# Patient Record
Sex: Female | Born: 1950 | Race: White | Hispanic: No
Health system: Midwestern US, Community
[De-identification: ages and names within clinical notes are randomized; demographics above are authoritative.]

## PROBLEM LIST (undated history)

## (undated) DIAGNOSIS — R109 Unspecified abdominal pain: Secondary | ICD-10-CM

## (undated) DIAGNOSIS — I10 Essential (primary) hypertension: Secondary | ICD-10-CM

## (undated) DIAGNOSIS — R1011 Right upper quadrant pain: Secondary | ICD-10-CM

---

## 2014-04-01 ENCOUNTER — Encounter

## 2014-04-08 LAB — EKG, 12 LEAD, INITIAL
Atrial Rate: 50 {beats}/min
Calculated P Axis: 71 degrees
Calculated R Axis: 6 degrees
Calculated T Axis: 62 degrees
P-R Interval: 176 ms
Q-T Interval: 520 ms
QRS Duration: 134 ms
QTC Calculation (Bezet): 474 ms
Ventricular Rate: 50 {beats}/min

## 2014-04-08 LAB — METABOLIC PANEL, COMPREHENSIVE
A-G Ratio: 1.3 (ref 0.7–2.8)
ALT (SGPT): 22 U/L (ref 12–78)
AST (SGOT): 17 U/L (ref 15–37)
Albumin: 3.9 g/dL (ref 3.5–4.7)
Alk. phosphatase: 55 U/L (ref 45–117)
Anion gap: 12 mmol/L (ref 10–20)
BUN: 21 mg/dL — ABNORMAL HIGH (ref 7–18)
Bilirubin, total: 0.5 mg/dL (ref 0.2–1.0)
CO2: 29 mmol/L (ref 21–32)
Calcium: 9 mg/dL (ref 8.5–10.1)
Chloride: 103 mmol/L (ref 98–107)
Creatinine: 1 mg/dL (ref 0.6–1.3)
GFR est AA: >60 mL/min/{1.73_m2} (ref >60)
GFR est non-AA: 60 mL/min/{1.73_m2} — ABNORMAL LOW (ref >60)
Globulin: 3 g/dL (ref 1.7–4.7)
Glucose: 111 mg/dL — ABNORMAL HIGH (ref 74–106)
Potassium: 4.2 mmol/L (ref 3.5–5.1)
Protein, total: 6.9 g/dL (ref 6.4–8.2)
Sodium: 140 mmol/L (ref 136–145)

## 2014-04-08 LAB — TSH 3RD GENERATION: TSH: 0.437 u[IU]/mL (ref 0.360–3.740)

## 2014-06-03 ENCOUNTER — Encounter

## 2014-06-18 LAB — CREATININE: Creatinine: 1.1 mg/dL (ref 0.6–1.3)

## 2014-06-18 LAB — BUN: BUN: 18 mg/dL (ref 7–18)

## 2014-06-18 MED ORDER — BARIUM SULFATE 2 % ORAL SUSP
2.1 % (w/v), 2.0 % (w/w) | Freq: Once | ORAL | Status: AC
Start: 2014-06-18 — End: 2014-06-18
  Administered 2014-06-18: 14:00:00 via ORAL

## 2014-06-18 MED ORDER — IOPAMIDOL 61 % IV SOLN
300 mg iodine /mL (61 %) | Freq: Once | INTRAVENOUS | Status: AC
Start: 2014-06-18 — End: 2014-06-18
  Administered 2014-06-18: 14:00:00 via INTRAVENOUS

## 2014-06-18 MED FILL — ISOVUE-300  61 % INTRAVENOUS SOLUTION: 300 mg iodine /mL (61 %) | INTRAVENOUS | Qty: 200

## 2014-06-18 MED FILL — READI-CAT 2  2.1 % (W/V), 2.0 % (W/W) ORAL SUSPENSION: 2.1 % (w/v), 2.0 % (w/w) | ORAL | Qty: 900

## 2014-07-05 ENCOUNTER — Encounter

## 2014-07-07 ENCOUNTER — Inpatient Hospital Stay: Admit: 2014-07-07 | Payer: PRIVATE HEALTH INSURANCE | Attending: Specialist

## 2014-07-07 DIAGNOSIS — R1010 Upper abdominal pain, unspecified: Secondary | ICD-10-CM

## 2014-07-10 ENCOUNTER — Ambulatory Visit: Payer: PRIVATE HEALTH INSURANCE

## 2015-06-08 ENCOUNTER — Inpatient Hospital Stay
Admit: 2015-06-08 | Discharge: 2015-06-09 | Disposition: A | Discharge status: Home / Self Care | Payer: PRIVATE HEALTH INSURANCE | Attending: Emergency Medicine

## 2015-06-08 DIAGNOSIS — M25571 Pain in right ankle and joints of right foot: Secondary | ICD-10-CM

## 2015-06-08 MED ORDER — DIPHENHYDRAMINE 25 MG CAP
25 mg | ORAL | Status: AC
Start: 2015-06-08 — End: 2015-06-08
  Administered 2015-06-09: via ORAL

## 2015-06-08 MED ORDER — KETOROLAC TROMETHAMINE 30 MG/ML INJECTION
30 mg/mL (1 mL) | INTRAMUSCULAR | Status: AC
Start: 2015-06-08 — End: 2015-06-08
  Administered 2015-06-09: via INTRAMUSCULAR

## 2015-06-08 NOTE — ED Provider Notes (Signed)
Patient is a 64 y.o. female presenting with ankle pain. The history is provided by the patient.   Ankle Pain    This is a new problem. The current episode started 2 days ago. The problem occurs constantly. The pain is present in the right foot. The quality of the pain is described as aching. The pain is severe. Associated symptoms include stiffness and itching. Pertinent negatives include no numbness, full range of motion, no back pain and no neck pain.    Patient has been having worsening irrigation in the right foot worsening for 2 days after getting a soft caste on.  She finds it itchy and painful.  No fever.  The soft cast was applied because of chronic foot pain.  It was MRIed and came out nonspecific.      Past Medical History:   Diagnosis Date   ??? Arthritis    ??? Endocrine disease    ??? Hypertension        Past Surgical History:   Procedure Laterality Date   ??? Hx gyn     ??? Hx orthopaedic           History reviewed. No pertinent family history.    Social History     Social History   ??? Marital status: DIVORCED     Spouse name: N/A   ??? Number of children: N/A   ??? Years of education: N/A     Occupational History   ??? Not on file.     Social History Main Topics   ??? Smoking status: Never Smoker   ??? Smokeless tobacco: Not on file   ??? Alcohol use No   ??? Drug use: No   ??? Sexual activity: Not on file     Other Topics Concern   ??? Not on file     Social History Narrative   ??? No narrative on file         ALLERGIES: Review of patient's allergies indicates no known allergies.    Review of Systems   Constitutional: Negative for activity change, appetite change and chills.   Eyes: Negative for discharge and itching.   Endocrine: Negative for cold intolerance and heat intolerance.   Musculoskeletal: Positive for arthralgias, joint swelling, myalgias and stiffness. Negative for back pain, gait problem and neck pain.   Skin: Positive for itching and wound. Negative for pallor and rash.    Allergic/Immunologic: Negative for environmental allergies and food allergies.   Neurological: Negative for dizziness, facial asymmetry, numbness and headaches.       Vitals:    06/08/15 2004 06/08/15 2013 06/08/15 2014   BP:   134/78   Pulse:   78   Resp:   16   Temp:   98 ??F (36.7 ??C)   SpO2:  98% 98%   Weight: 88.5 kg (195 lb)     Height: 5\' 7"  (1.702 m)              Physical Exam   Constitutional: She appears well-developed and well-nourished. No distress.   Musculoskeletal:        Right ankle: She exhibits normal range of motion, no swelling, no ecchymosis and no deformity. No tenderness. No medial malleolus, no AITFL, no CF ligament, no posterior TFL, no head of 5th metatarsal and no proximal fibula tenderness found.        Right foot: There is decreased range of motion, tenderness (medial) and bony tenderness. There is no swelling, normal capillary refill, no crepitus,  no deformity and no laceration.   Soft caste removed and irritated and wrinkley   Skin: She is not diaphoretic.   Nursing note and vitals reviewed.       MDM  Number of Diagnoses or Management Options     Amount and/or Complexity of Data Reviewed  Clinical lab tests: reviewed and ordered      ED Course       Procedures               <EMERGENCY DEPARTMENT CASE SUMMARY>    Impression/Differential Diagnosis: foot irritation    Plan: dc home    ED Course: sx improved after soft cast removed.  Ace wrap reapplied    Final Impression/Diagnosis: right ankle irritation     Patient condition at time of disposition: stable      I have reviewed the following home medications:    Prior to Admission medications    Medication Sig Start Date End Date Taking? Authorizing Provider   aspirin 81 mg chewable tablet Take 81 mg by mouth daily.   Yes Phys Other, MD   losartan-hydrochlorothiazide (HYZAAR) 100-12.5 mg per tablet Take 1 Tab by mouth daily.   Yes Phys Other, MD   clonazePAM (KLONOPIN) 1 mg tablet Take  by mouth two (2) times a day.   Yes Phys Other, MD    topiramate (TOPAMAX) 100 mg tablet Take  by mouth two (2) times a day.   Yes Phys Other, MD   escitalopram oxalate (LEXAPRO) 20 mg tablet Take 20 mg by mouth daily.   Yes Phys Other, MD   gabapentin (NEURONTIN) 100 mg capsule Take 200 mg by mouth daily.   Yes Phys Other, MD   methotrexate (RHEUMATREX) 2.5 mg tablet Take 15 mg by mouth every Wednesday.   Yes Phys Other, MD   folic acid (FOLVITE) 1 mg tablet Take 1 mg by mouth daily.   Yes Phys Other, MD   Levothyroxine (TIROSINT) 137 mcg cap Take  by mouth.   Yes Phys Other, MD         Leonia Corona, MD

## 2015-06-08 NOTE — ED Triage Notes (Signed)
Arrived in ER c/o right ankle pain since weeks; cast placed on Tuesday by her podiatrist. Pain 10/10 reported. Bandage removed by MD Cartmill; cleansed and ace bandage applied with air cast.

## 2015-06-08 NOTE — ED Notes (Signed)
Patient is AAOX3, is in no respiratory distress,  Reduced pain, speech clear and coherent, gait steady, friend at bedside, FUI given, understanding verbalized; discharged to home/self care in stable condition as per MD Cartmill; left ER with her friend.

## 2015-06-09 MED FILL — KETOROLAC TROMETHAMINE 30 MG/ML INJECTION: 30 mg/mL (1 mL) | INTRAMUSCULAR | Qty: 2

## 2015-06-09 MED FILL — DIPHENHYDRAMINE 25 MG CAP: 25 mg | ORAL | Qty: 1

## 2021-01-10 IMAGING — MR MRI LUMBAR SPINE WITHOUT CONTRAST
4 of 7 series · 20 of 48 positions shown · IV contrast (gadolinium)
Comparison: None

MRI LUMBAR SPINE WITHOUT CONTRAST, 01/10/2021 [DATE]: 
CLINICAL INDICATION: Lumbar pain, left lower extremity radiculopathy.
TECHNIQUE: Multiplanar, multiecho position MR images of the lumbar spine were 
performed without intravenous gadolinium enhancement.

[Series 101: survey · axial · 10.0mm · 1.39mm/px · z∈[-33,+201]mm · 4 of 10 slices shown]
[im 1/10]
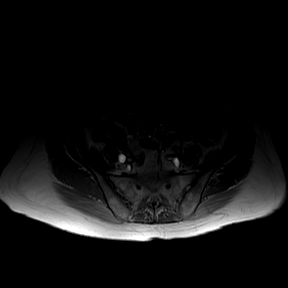
[im 4/10]
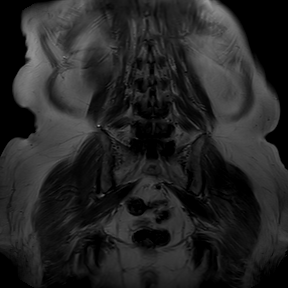
[im 7/10]
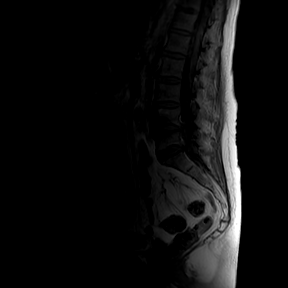
[im 10/10]
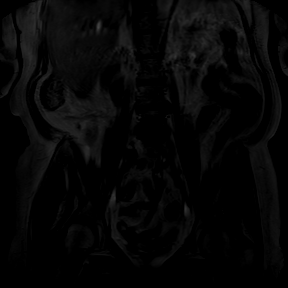

[Series 201: t2w_cor-surv · coronal · 6.0mm · 0.60mm/px · 2 of 5 slices shown]
[im 1/5]
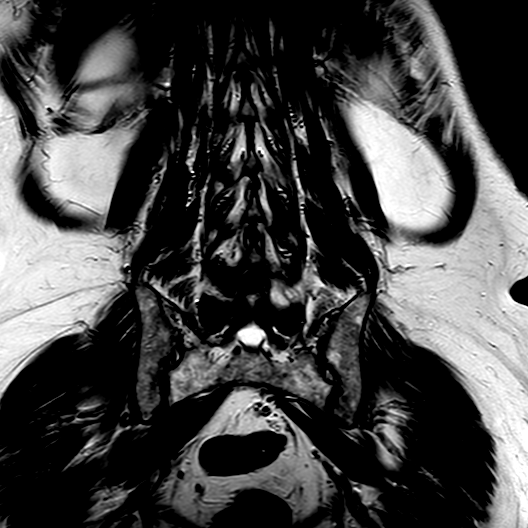
[im 5/5]
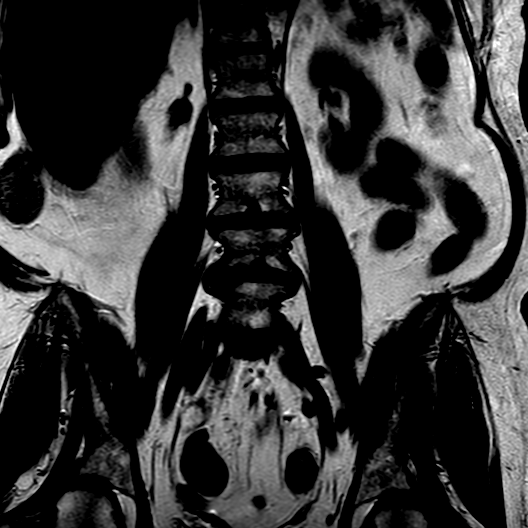

[Series 301: t1_tse_sag · sagittal · 4.0mm · 0.48mm/px · 5 of 17 slices shown]
[im 1/17]
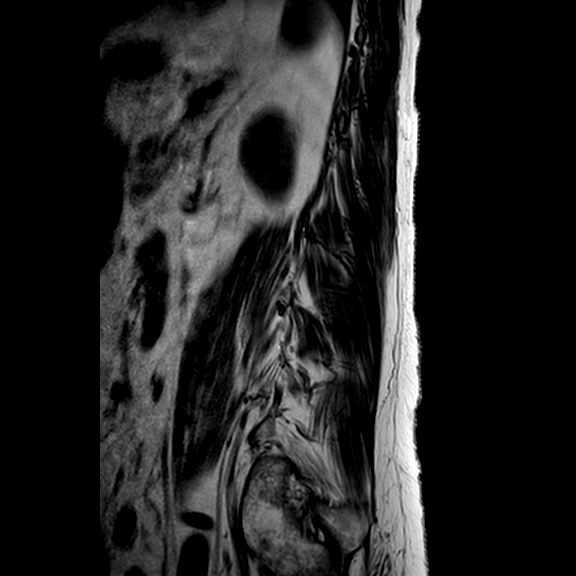
[im 4/17]
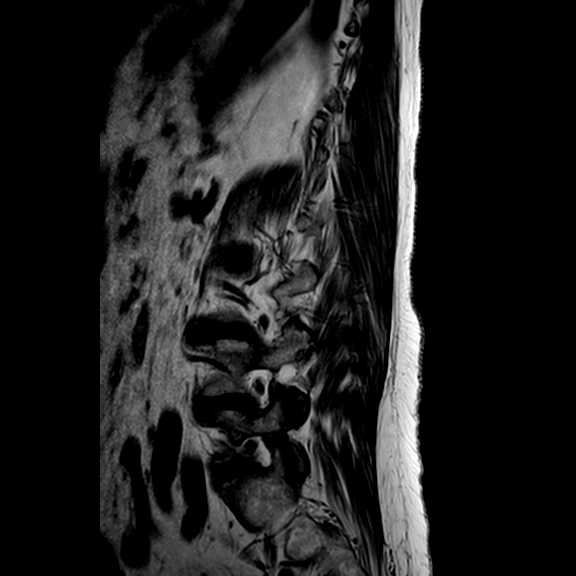
[im 7/17]
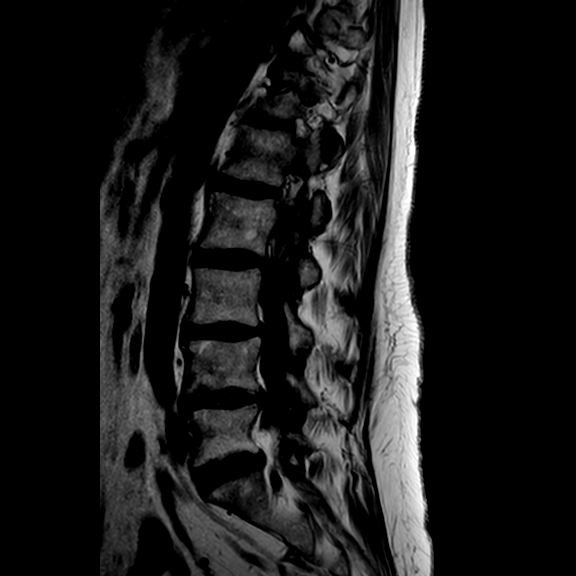
[im 10/17]
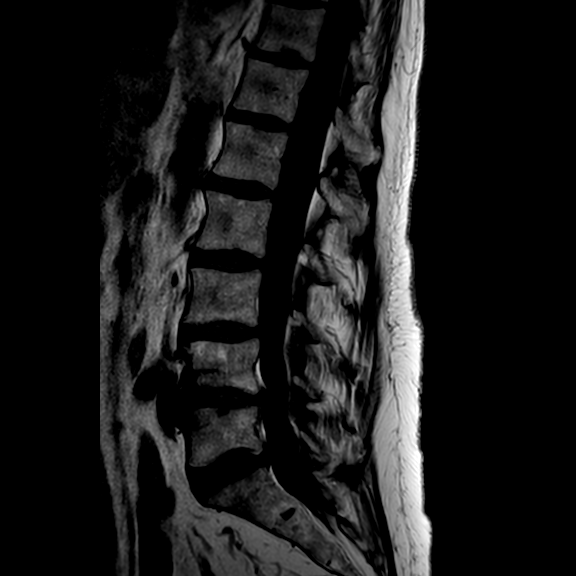
[im 17/17]
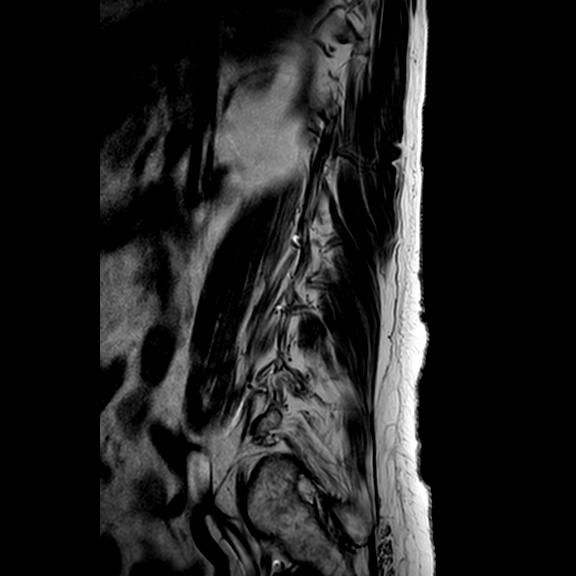

[Series 701: T1 · axial · 4.0mm · 0.38mm/px · z∈[-36,+147]mm · 9 of 35 slices shown]
[im 1/35]
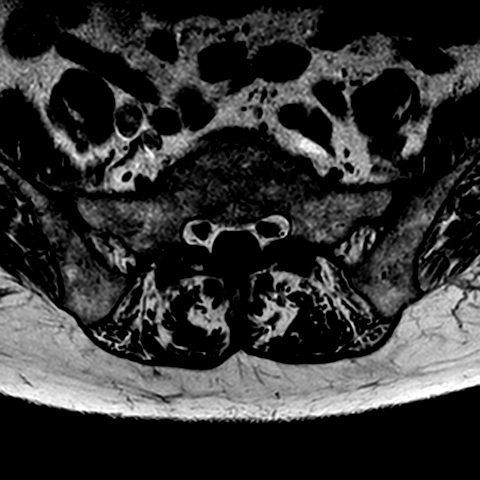
[im 6/35]
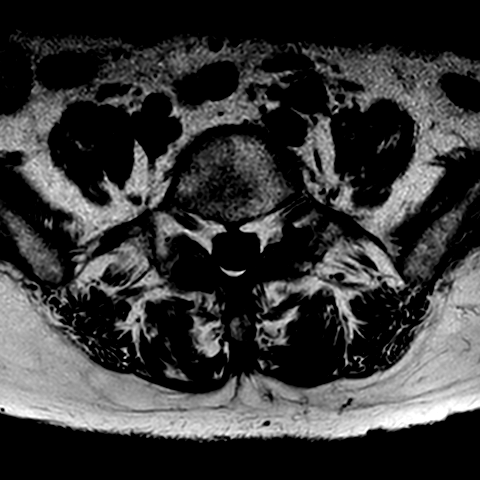
[im 12/35]
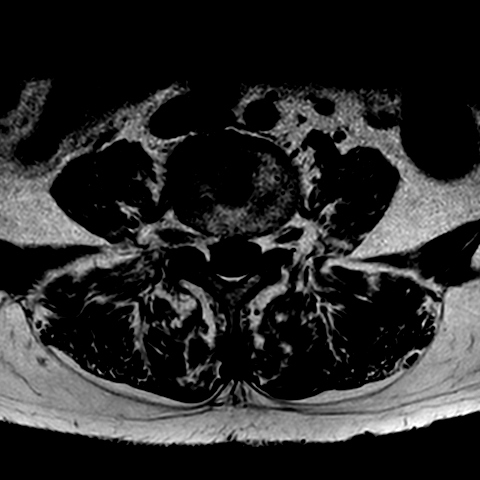
[im 15/35]
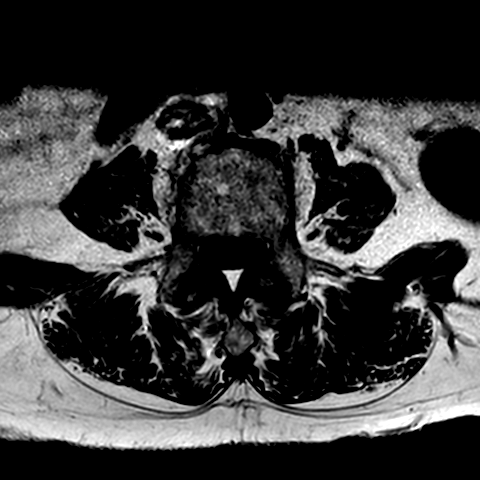
[im 18/35]
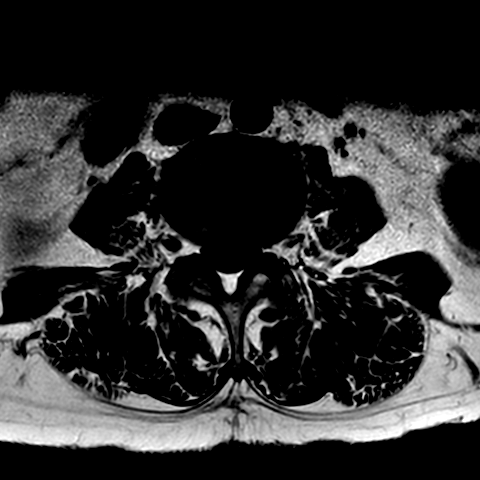
[im 20/35]
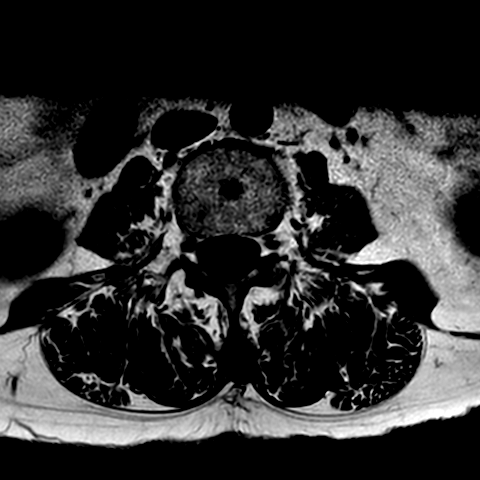
[im 23/35]
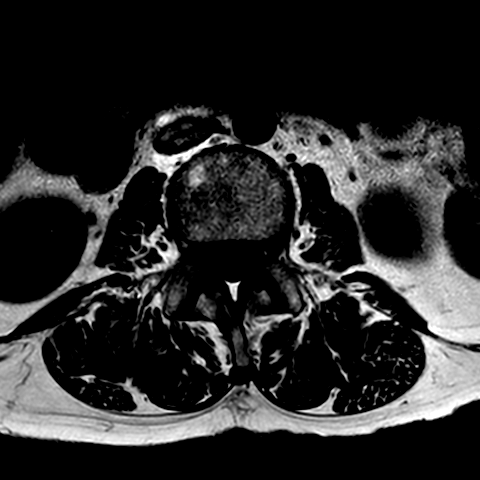
[im 29/35]
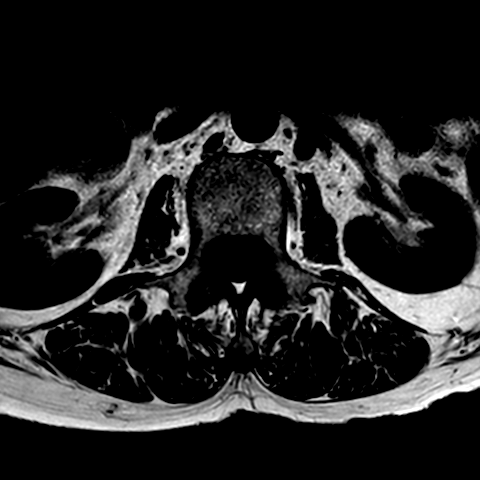
[im 35/35]
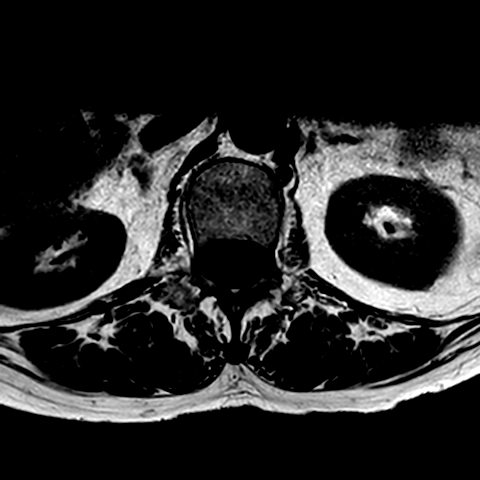

[20 of 48 positions shown; findings below may reference images not displayed]

FINDINGS: Nomenclature is based on 5 lumbar type vertebral bodies. Mild chronic 
L4 inferior endplate compression fracture. L3-5 Schmorl nodes. Small marginal 
osteophytes at L3-5. No spondylolisthesis. No pars defect. The conus tip 
terminates at the L1 vertebral body level. The aorta is normal in diameter. The 
posterior paraspinal soft tissues are negative. No abnormal enhancement within 
the spinal cord or spinal canal. No abnormal disc enhancement. 
Modic I-II: Type I Modic changes at L4-5 on the right. 
Ligamentum Flavum > 2.5 mm: All levels 
T12-L1: The disc is normal in height and signal. No disc herniation. Normal 
facets. No spinal canal or neural foraminal stenosis. 
L1-L2: The disc is normal in height and signal. No disc herniation. Mild facet 
arthropathy. No spinal canal or neural foraminal stenosis. 
L2-L3: The disc is normal in height and signal. No disc herniation. Mild facet 
arthropathy. No spinal canal or neural foraminal stenosis. 
L3-L4: Mild disc bulge, mild disc space narrowing and disc desiccation. No disc 
herniation. Mild facet arthropathy and ligamentum flavum hypertrophy. Prominent 
dorsal epidural fat. Mild central canal/lateral recess stenosis. No neural 
foraminal stenosis. 
L4-L5: Small central annular tear. Broad-based disc bulge, more marked on the 
right. Moderate disc space narrowing and disc desiccation. No disc herniation. 
Moderate facet arthropathy and ligamentum flavum hypertrophy. Prominent dorsal 
epidural fat. Mild central canal/lateral recess stenosis. Mild right neural 
foraminal stenosis. 
L5-S1: Broad-based disc bulge. The disc is normal in height and signal. No disc 
herniation. Mild facet arthropathy and small posterior facet joint cysts. No 
spinal canal stenosis. Mild left neural foraminal stenosis.
IMPRESSION: 1.  Multifocal degenerative change and mild chronic L4 inferior endplate 
compression fracture. 
2.  L3-L4 mild central canal/lateral recess stenosis. 
3.  L4-L5 type I Modic changes, small central annular tear, mild central 
canal/lateral recess/right neural foraminal stenosis. 
4.  L5-S1 mild left neural foraminal stenosis.

## 2021-04-03 IMAGING — DX HAND 3 VIEWS LEFT
3 series · 3 of 3 positions shown · non-contrast
Comparison: None.

FINAL Diagnostic Imaging Report 
________________________________________________________________________________________________ 
HAND 3 VIEWS LEFT, HAND 3 VIEWS RIGHT, 04/03/2021 [DATE]: 
CLINICAL INDICATION: Bilateral hand pain.

[PA]
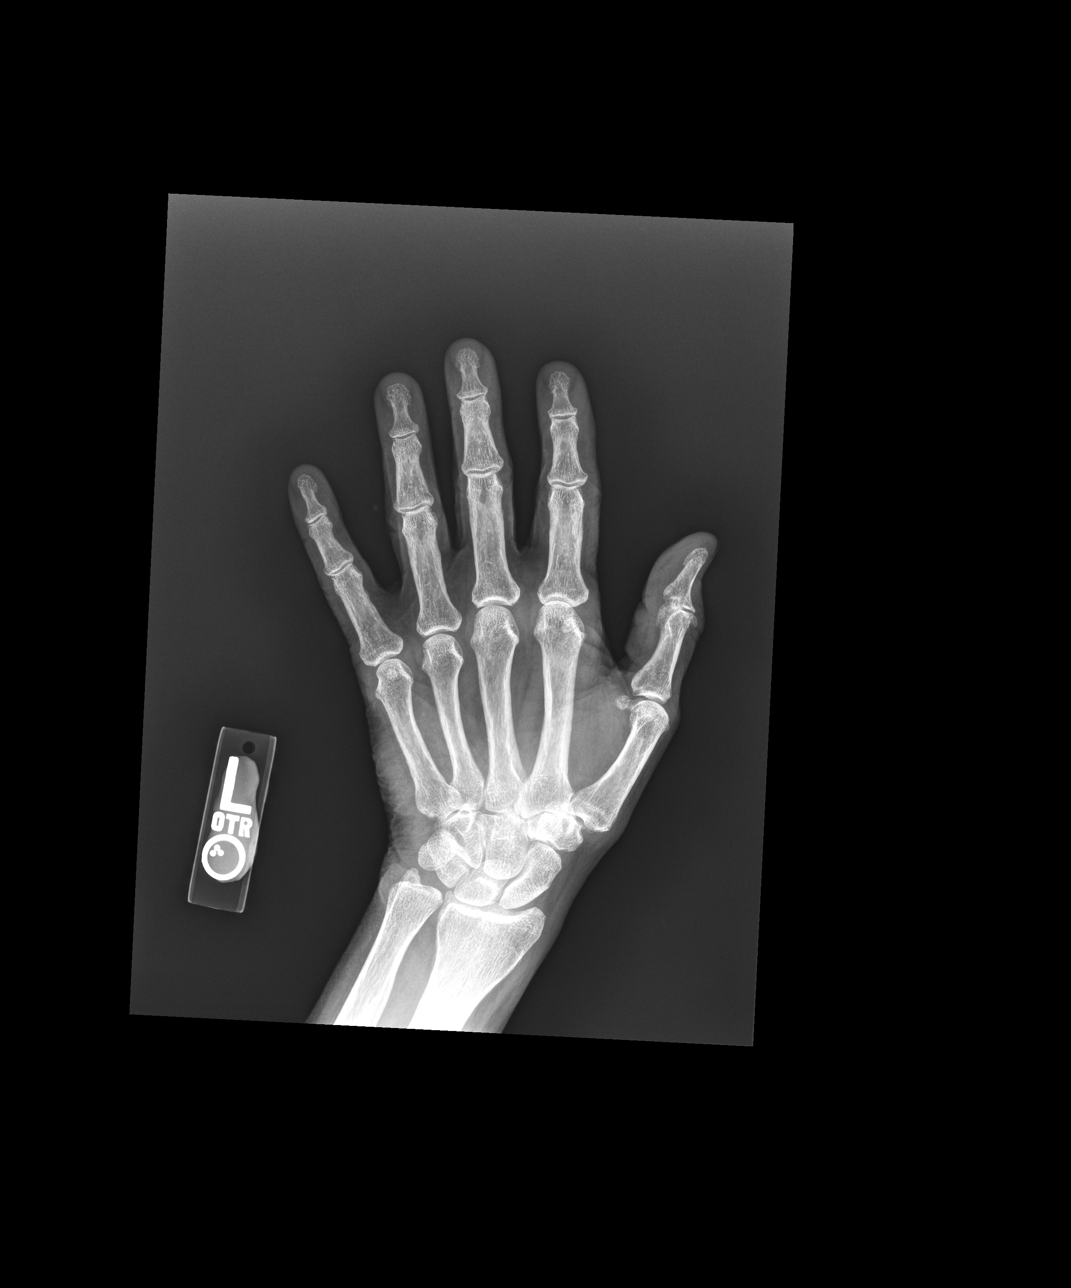

[oblique]
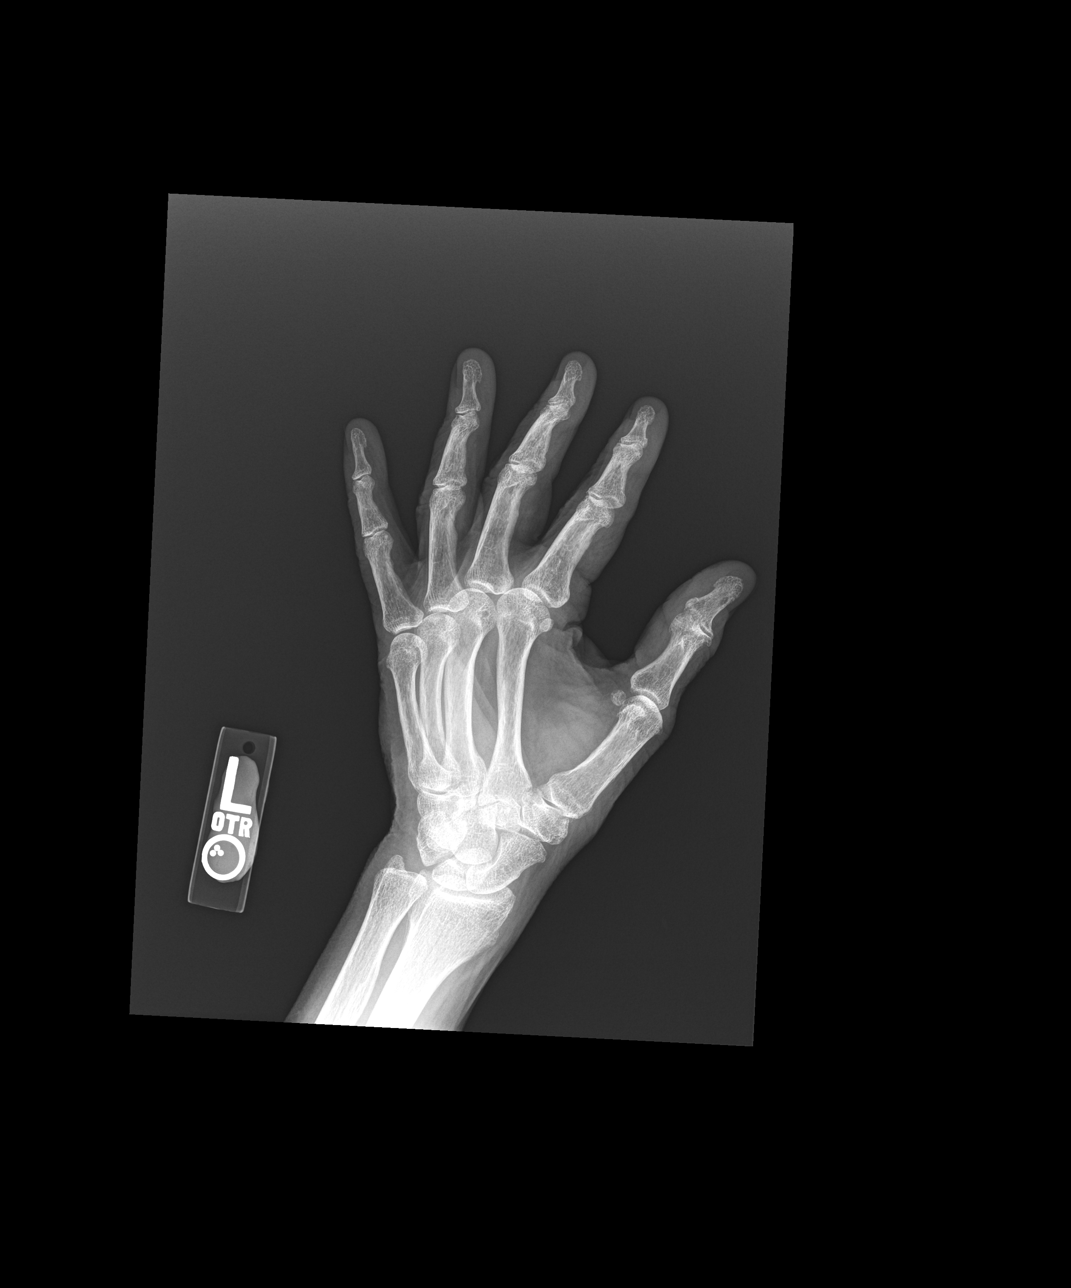

[lateral]
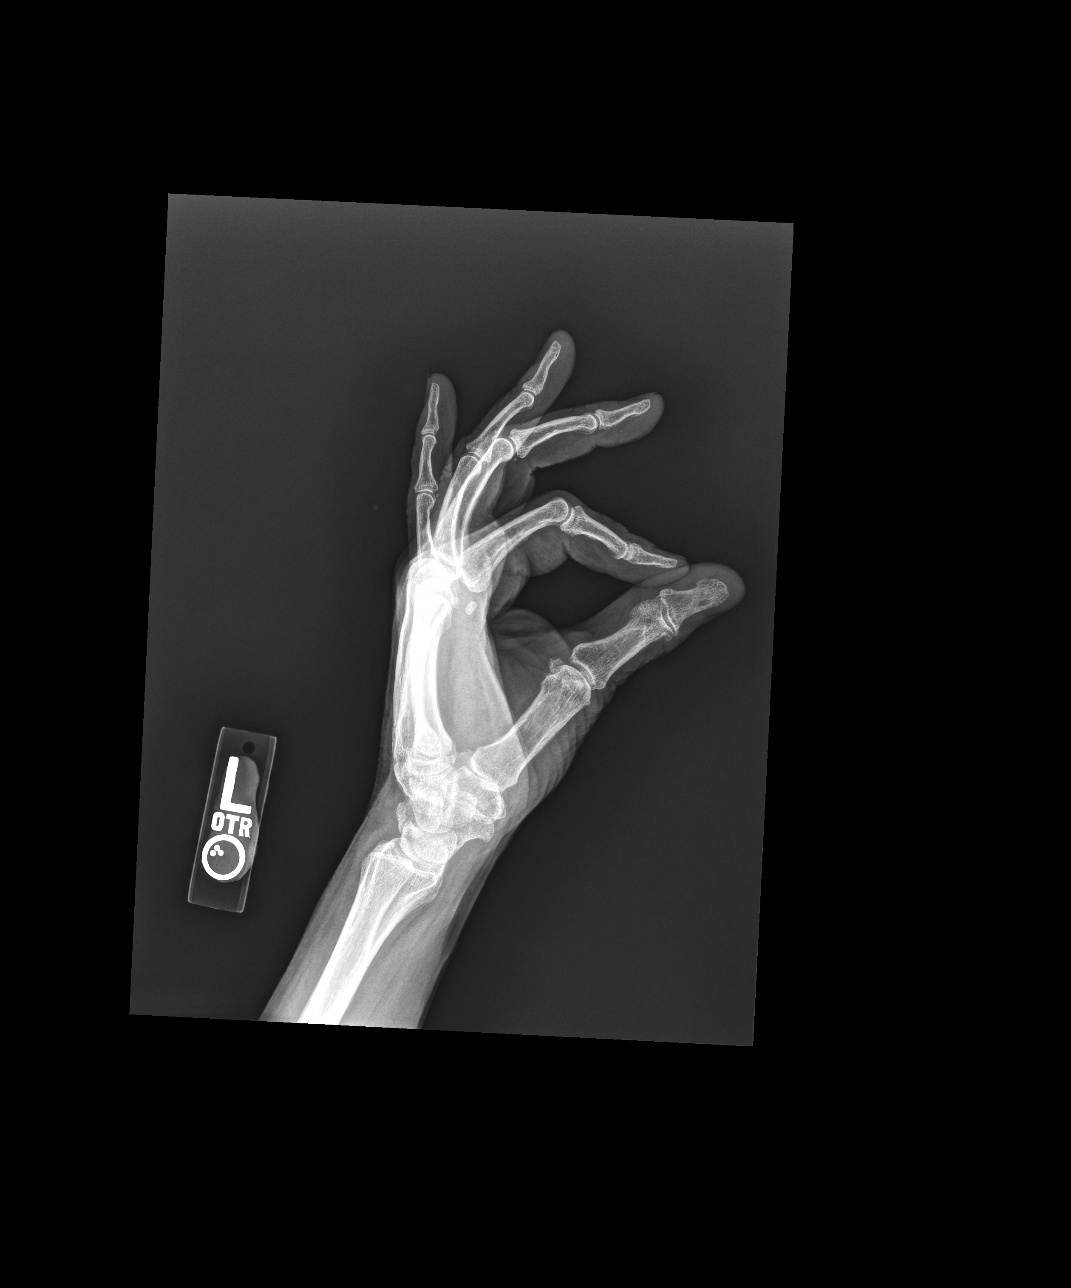

[3 of 3 positions shown; findings below may reference images not displayed]

FINDINGS: No fractures or dislocations. Moderate degenerative change of the 
right thumb carpometacarpal (B6B-Z) joint, mild degenerative change of the right 
thumb and second metacarpophalangeal (MCP) joints and degenerative change of the 
several right interphalangeal (IP) joints, most marked involving the thumb. 
Remodeling of the right thumb metacarpal base, likely chronic, healed fracture 
deformity. Mild degenerative change of the left B6B-Z and second MCP joints and 
moderate degenerative change of the left thumb IP joint. Normal bone density. No 
soft tissue swelling.
IMPRESSION: 1.  Multifocal degenerative change, most marked involving the right B6B-Z joint. 
2.  Probable chronic, healed right thumb metacarpal base fracture deformity.

## 2021-04-03 IMAGING — DX HAND 3 VIEWS RIGHT
3 series · 3 of 3 positions shown · non-contrast
Comparison: None.

FINAL Diagnostic Imaging Report 
________________________________________________________________________________________________ 
HAND 3 VIEWS LEFT, HAND 3 VIEWS RIGHT, 04/03/2021 [DATE]: 
CLINICAL INDICATION: Bilateral hand pain.

[PA]
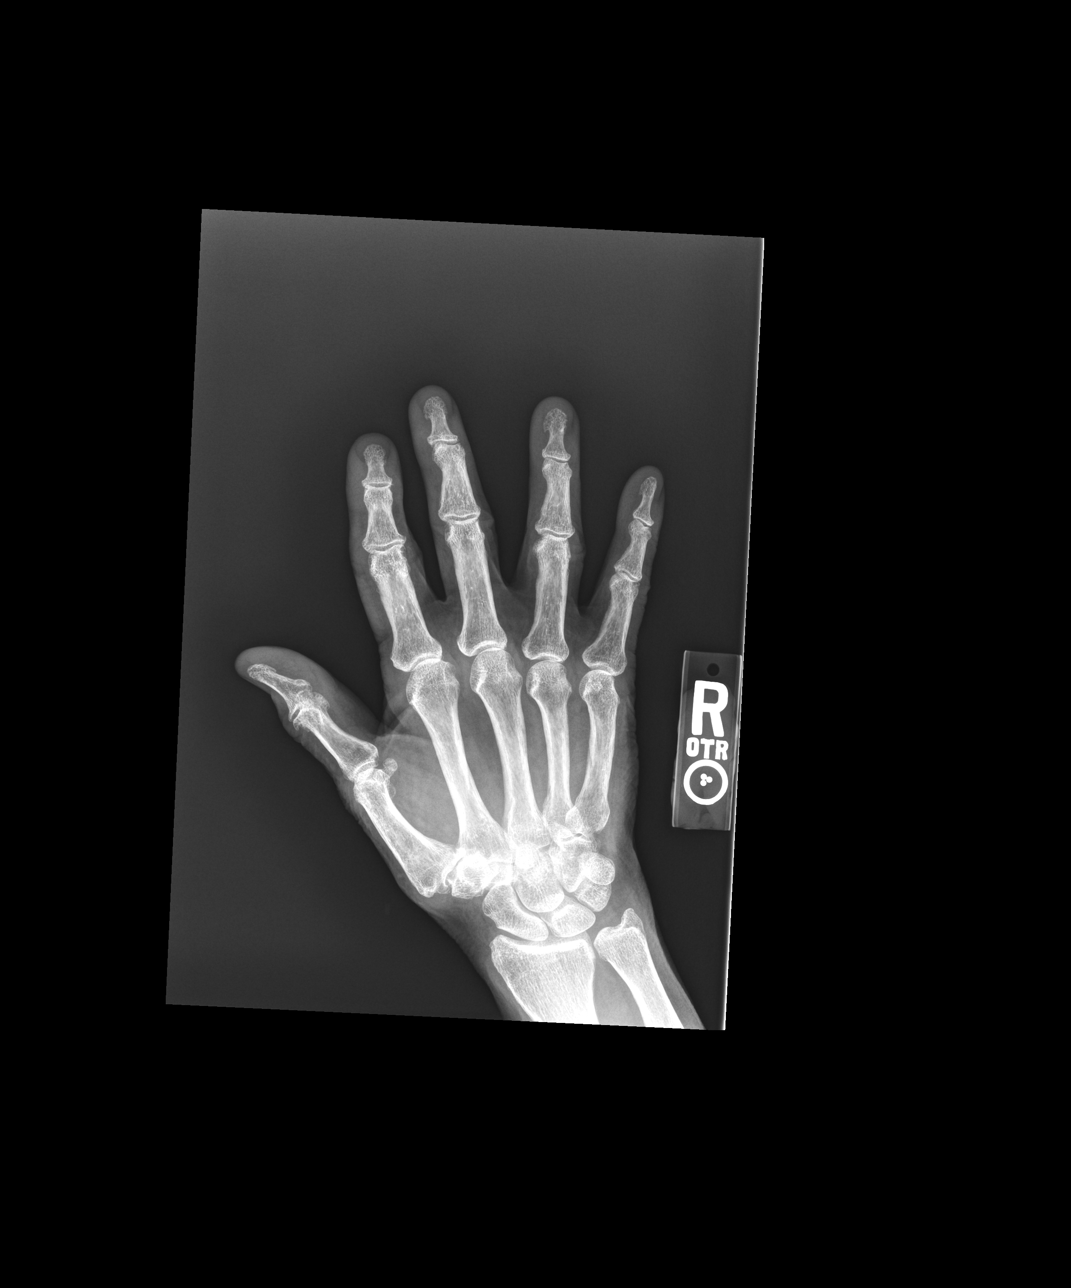

[oblique]
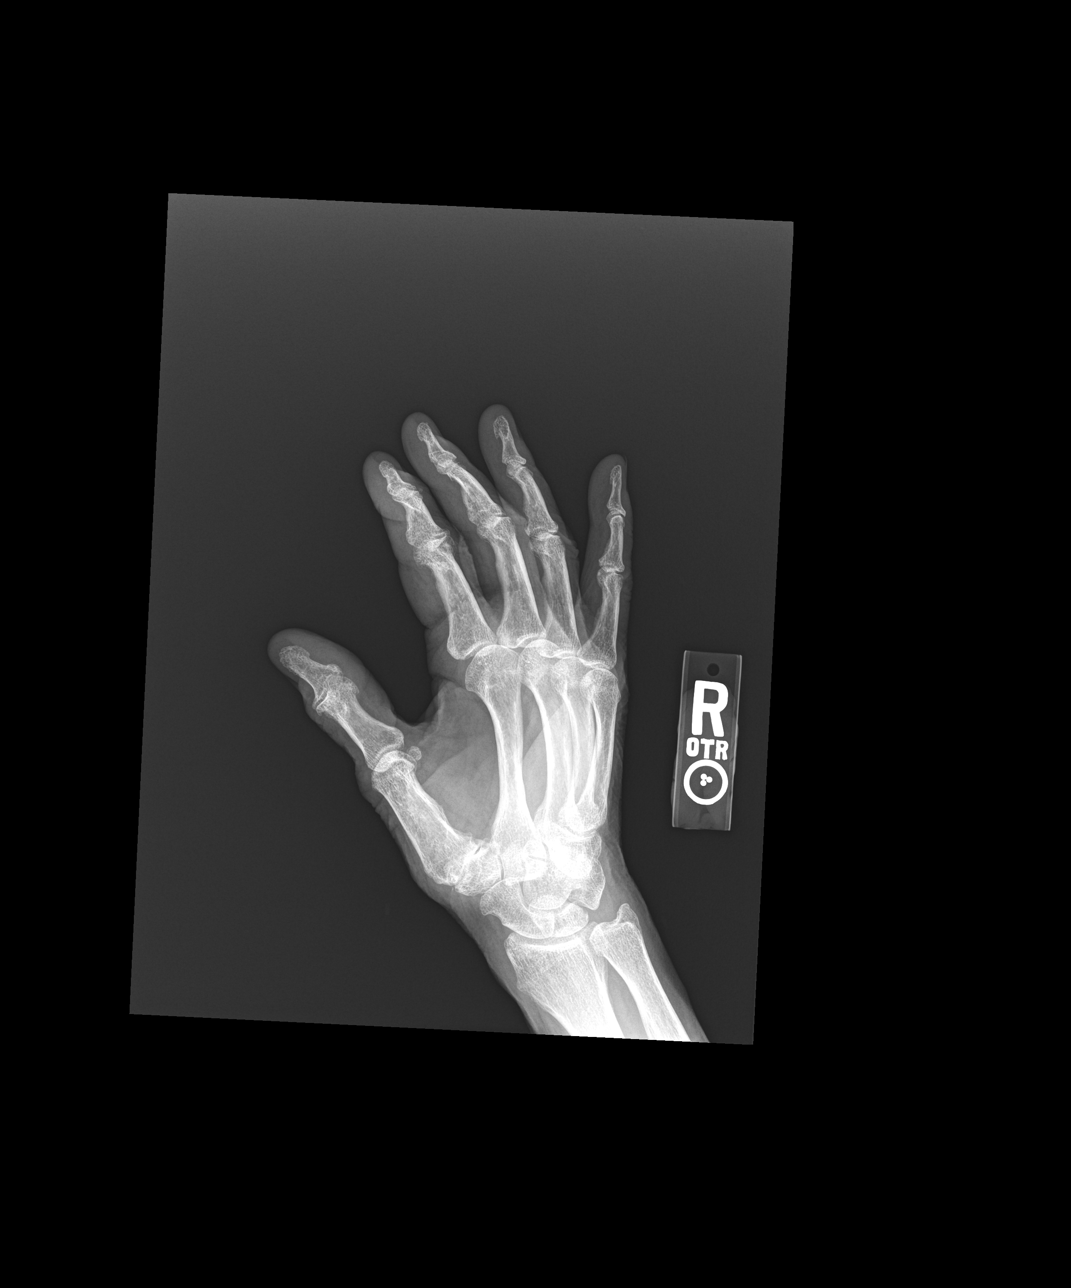

[lateral]
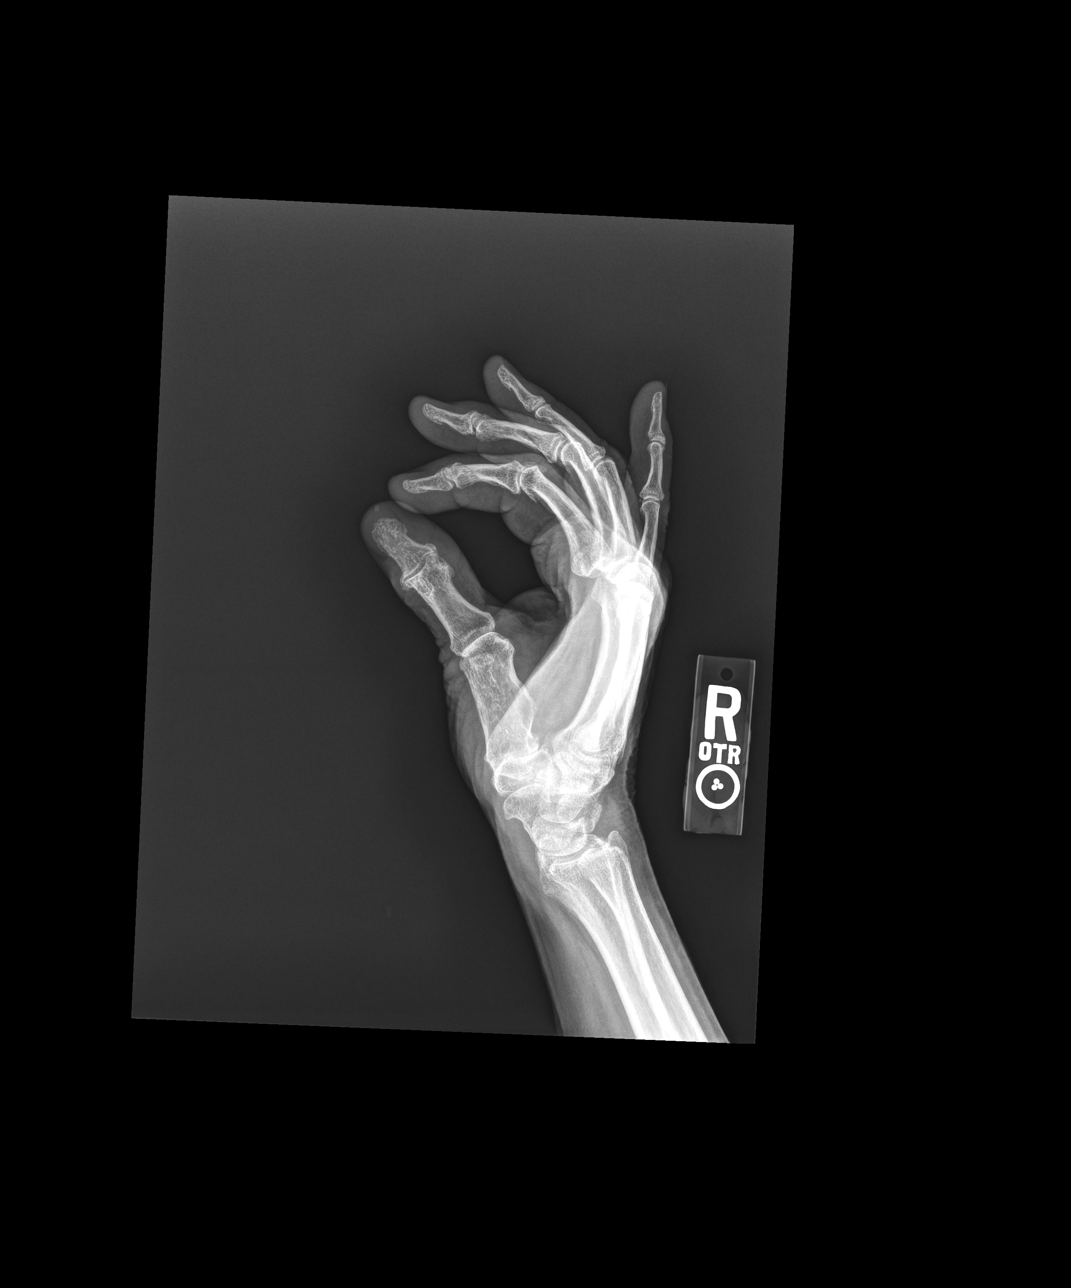

[3 of 3 positions shown; findings below may reference images not displayed]

FINDINGS: No fractures or dislocations. Moderate degenerative change of the 
right thumb carpometacarpal (B6B-Z) joint, mild degenerative change of the right 
thumb and second metacarpophalangeal (MCP) joints and degenerative change of the 
several right interphalangeal (IP) joints, most marked involving the thumb. 
Remodeling of the right thumb metacarpal base, likely chronic, healed fracture 
deformity. Mild degenerative change of the left B6B-Z and second MCP joints and 
moderate degenerative change of the left thumb IP joint. Normal bone density. No 
soft tissue swelling.
IMPRESSION: 1.  Multifocal degenerative change, most marked involving the right B6B-Z joint. 
2.  Probable chronic, healed right thumb metacarpal base fracture deformity.

## 2023-04-30 IMAGING — MR MRI BRAIN WITH  IAC W/WO CONTRAST
12 of 15 series · 26 of 48 positions shown · IV contrast (Gadolinium)
Comparison: CT sinus April 30, 2023.

________________________________________________________________________________________________ 
MRI BRAIN WITH  IAC W/WO CONTRAST,04/30/2023 [DATE]: 
CLINICAL INDICATION: Dizziness And Giddiness , hearing loss. Dizziness. Falling.
TECHNIQUE: MRI performed without and with contrast using IAC protocol. 7.5 mL of 
Gadavist were injected intravenously by hand. Patient was scanned on a
magnet.

[Series 102: mpr - smartbrain · axial · 1.1mm · 1.09mm/px · z∈[+0,+174]mm · 2 of 2 slices shown]
[im 1/2]
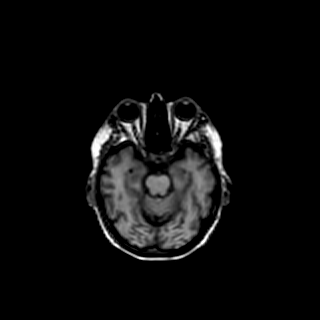
[im 2/2]
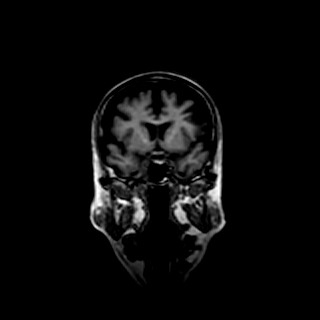

[Series 203: dadc map · axial · 5.0mm · 1.03mm/px · z∈[-61,+94]mm · 2 of 27 slices shown (1 of 2)]
[im 1/27]
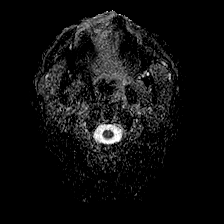
[im 27/27]
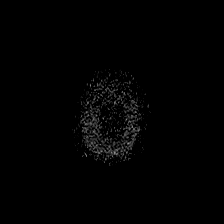

[Series 204: (id) · axial · 5.0mm · 1.03mm/px · z∈[-61,+94]mm · 2 of 27 slices shown (1 of 2)]
[im 1/27]
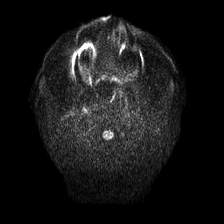
[im 27/27]
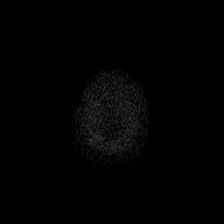

[Series 303: dadc map · coronal · 5.0mm · 0.81mm/px · 2 of 30 slices shown (2 of 2)]
[im 1/30]
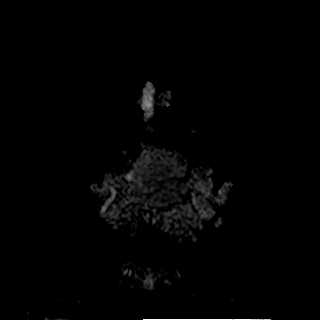
[im 30/30]
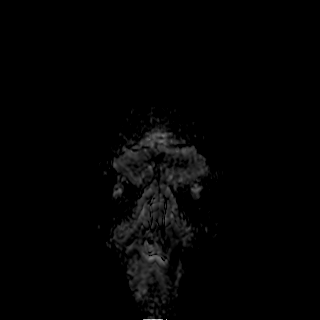

[Series 304: (id) · coronal · 5.0mm · 0.81mm/px · 2 of 31 slices shown (2 of 2)]
[im 1/31]
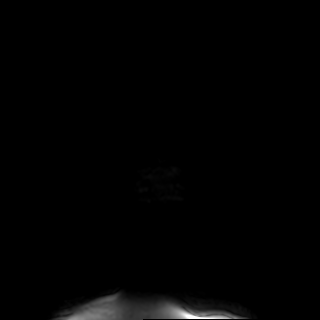
[im 31/31]
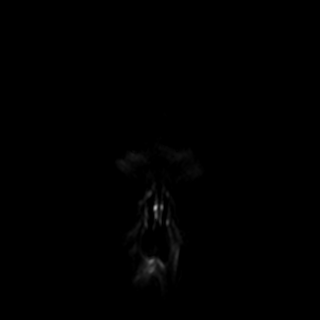

[Series 401: t1_se_sag · sagittal · 4.0mm · 0.43mm/px · 2 of 28 slices shown]
[im 1/28]
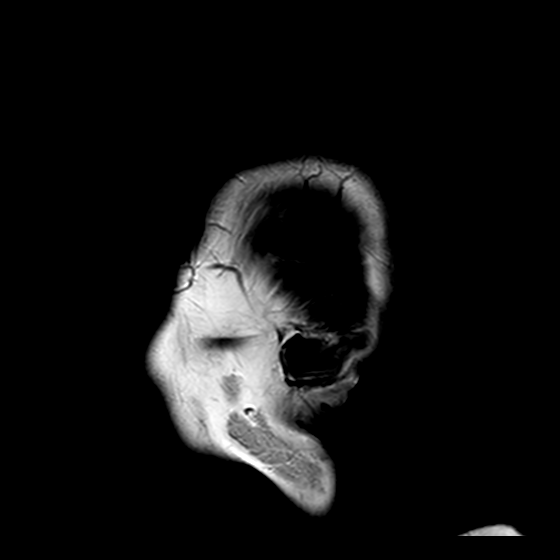
[im 28/28]
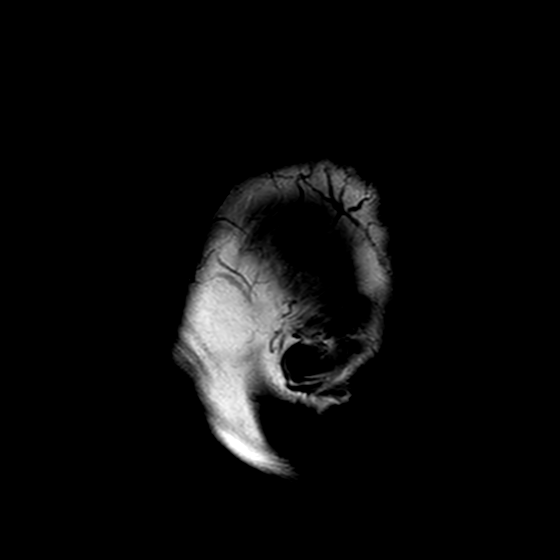

[Series 501: flair_ax+fs · axial · 5.0mm · 0.49mm/px · z∈[-61,+95]mm · 2 of 27 slices shown]
[im 1/27]
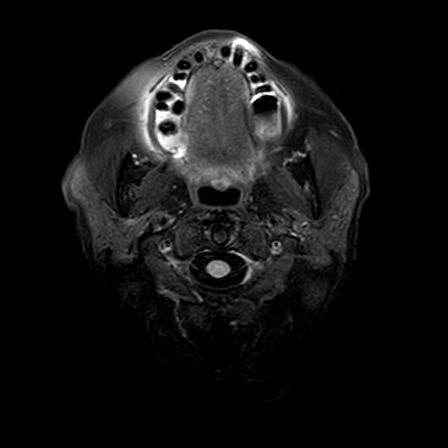
[im 27/27]
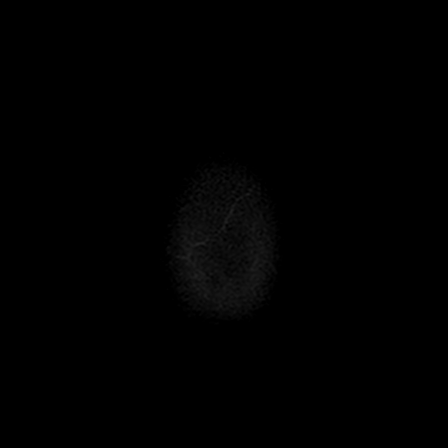

[Series 602: swip · axial · 10.0mm · 0.36mm/px · z∈[-46,+65]mm · 7 of 140 slices shown]
[im 1/140]
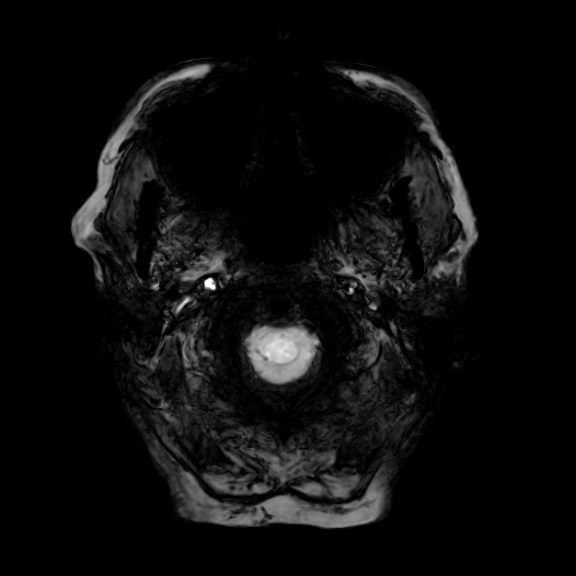
[im 28/140]
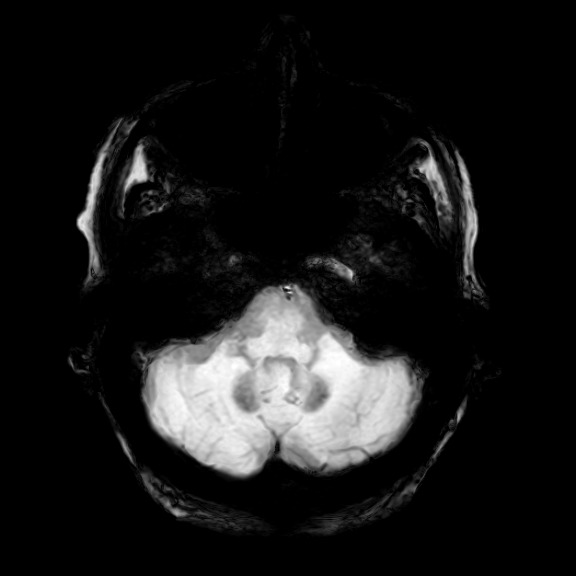
[im 42/140]
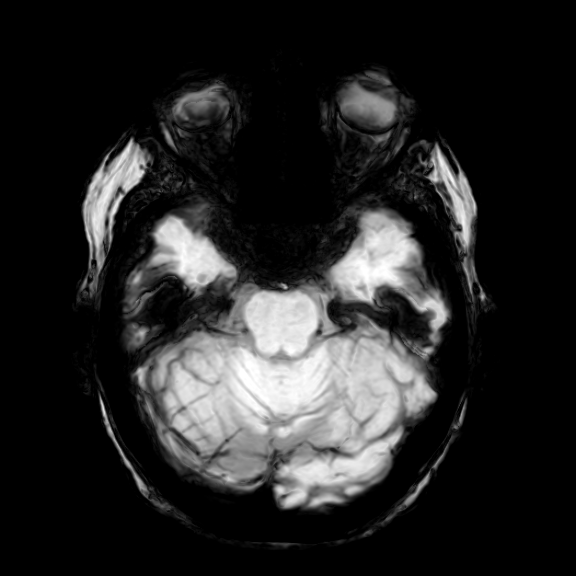
[im 56/140]
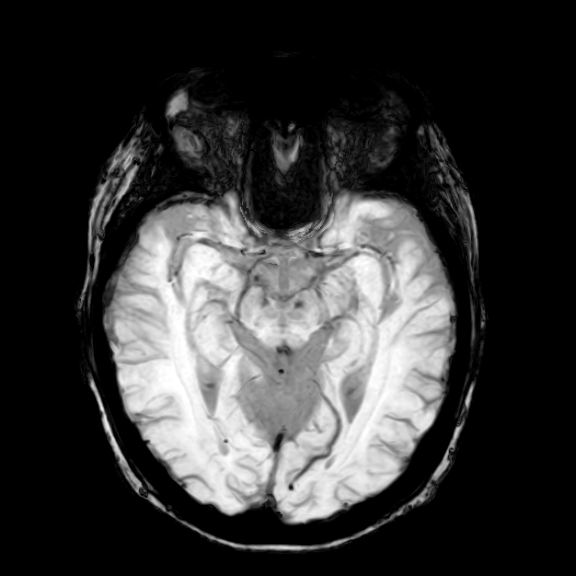
[im 84/140]
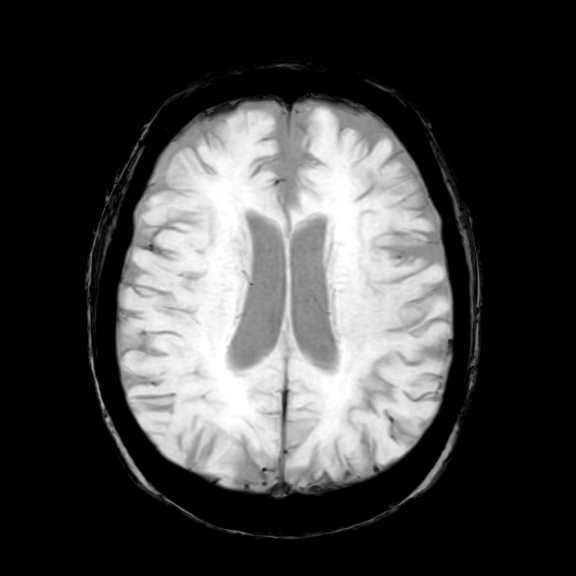
[im 98/140]
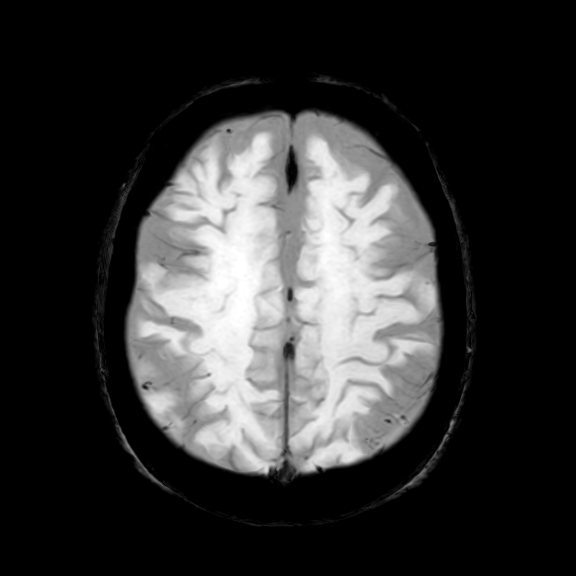
[im 112/140]
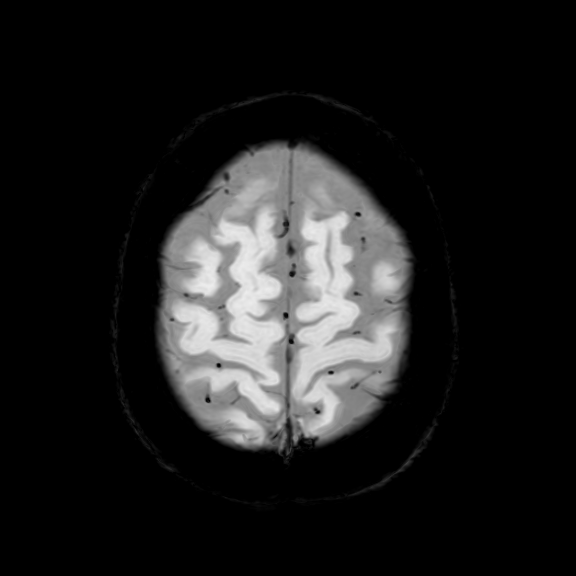

[Series 801: T1 · axial · 3.0mm · 0.59mm/px · 1 of 11 slices shown]
[im 1/11]
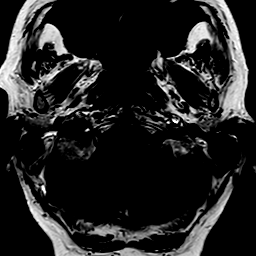

[Series 901: T1 fat-sat post-contrast · axial · 3.0mm · 0.59mm/px · 1 of 11 slices shown (1 of 2)]
[im 1/11]
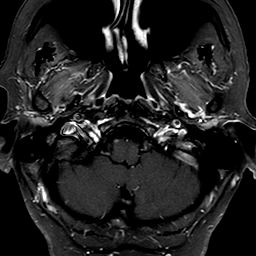

[Series 1001: T1 fat-sat post-contrast · coronal · 3.0mm · 0.59mm/px · 1 of 11 slices shown (2 of 2)]
[im 1/11]
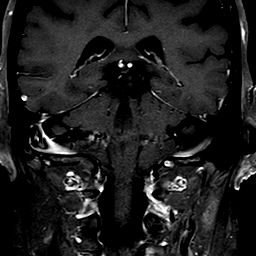

[Series 1101: T1 post-contrast · axial · 5.0mm · 0.51mm/px · z∈[-61,+95]mm · 2 of 27 slices shown]
[im 1/27]
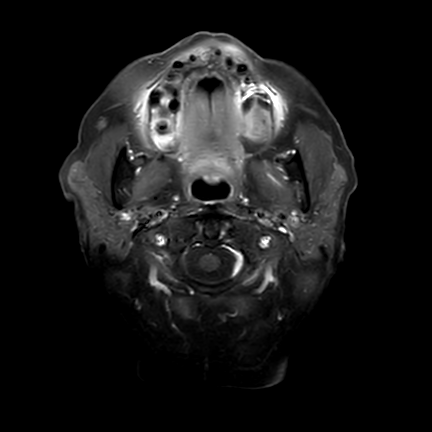
[im 27/27]
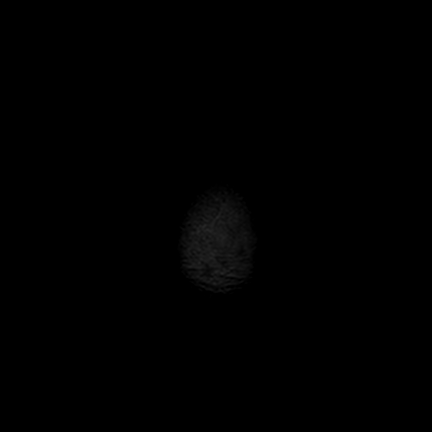

[26 of 48 positions shown; findings below may reference images not displayed]

FINDINGS: -------------------------------------------------------------------------------- 
------ 
INTERNAL AUDITORY CANALS:  
Dedicated imaging of the internal auditory canals demonstrates normal cranial 
nerve VIl and [place] complexes bilaterally. No cerebellopontine angle or internal 
auditory canal mass. No pathologic cranial nerve or temporal bone contrast 
enhancement. Inner ear structures demonstrate expected fluid signal intensity. 
No middle ear or mastoid effusion. Vascular loop right porus acusticus. No 
labyrinthine hemorrhage. 
-------------------------------------------------------------------------------- 
------ 
INTRACRANIAL:  
Moderate degree of nonspecific periventricular and deep white matter T2 FLAIR 
hyperintensity likely related to chronic microangiopathy. No mass or abnormal 
extra-axial fluid collection. 
No acute ischemia. Couple of abnormal foci of susceptibility artifact in the 
left frontal lobe, nonspecific. While. Patency of the intracranial vascular flow 
voids.  No acute intracranial hemorrhage, mass effect, midline shift.  No 
hydrocephalus. Cerebral volume is age appropriate. No pathologic contrast 
enhancement. 
-------------------------------------------------------------------------------- 
------ 
OTHER: 
ORBITS/SINUSES:  Visualized orbits show no acute abnormality or mass.  
Visualized paranasal sinuses are clear. 
MARROW SIGNAL/SOFT TISSUES: No focal suspect signal abnormality. 
-------------------------------------------------------------------------------- 
------
IMPRESSION: Normal MRI of the internal auditory canals and labyrinthine structures. 
No acute intracranial process. Moderate chronic microangiopathy. 
There are a couple of abnormal foci of susceptibility in the left frontal lobe, 
nonspecific, but may reflect chronic changes of previous microhemorrhage.

## 2023-04-30 IMAGING — CT CT SINUS WITHOUT CONTRAST
3 series · 14 of 47 positions shown, 16 images · non-contrast
Comparison: There are no previous exams available for comparison.

________________________________________________________________________________________________ 
CT SINUS WITHOUT CONTRAST, 04/30/2023 [DATE]: 
CLINICAL INDICATION: Constant drainage. 
A search for DICOM formatted images was conducted for prior CT imaging studies 
completed at a non-affiliated media free facility.
TECHNIQUE: The paranasal sinuses were scanned without contrast on a high 
resolution CT scanner using dose reduction techniques. Routine MPR 
reconstructions were performed.

[Series 3: axial · axial · 0.46mm/px · z∈[-197,-60]mm · 8 of 265 slices shown, 10 images]
[im 19/265  brain]
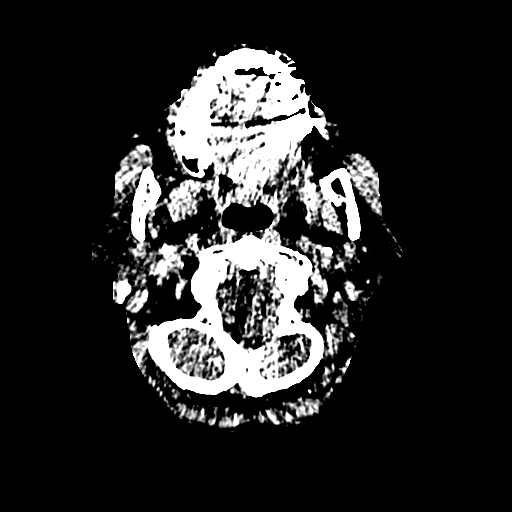
[im 19/265  bone]
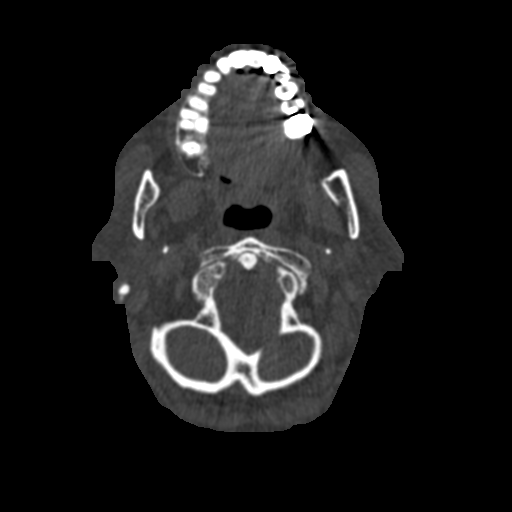
[im 55/265  bone]
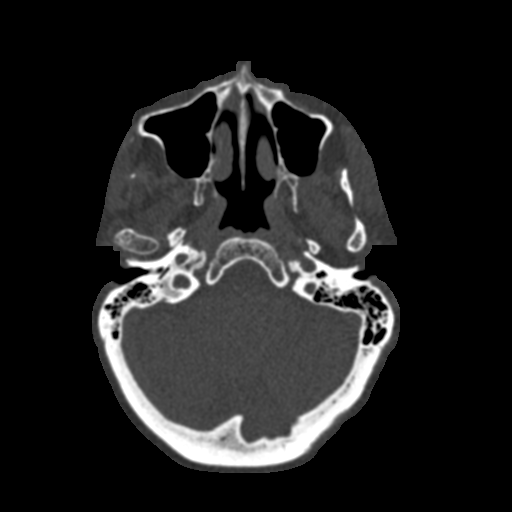
[im 82/265  bone]
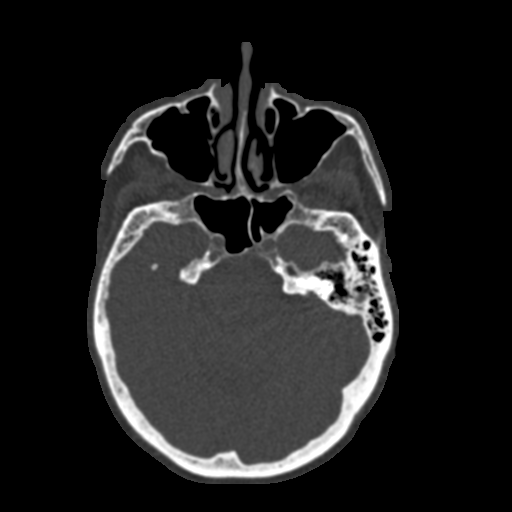
[im 119/265  bone]
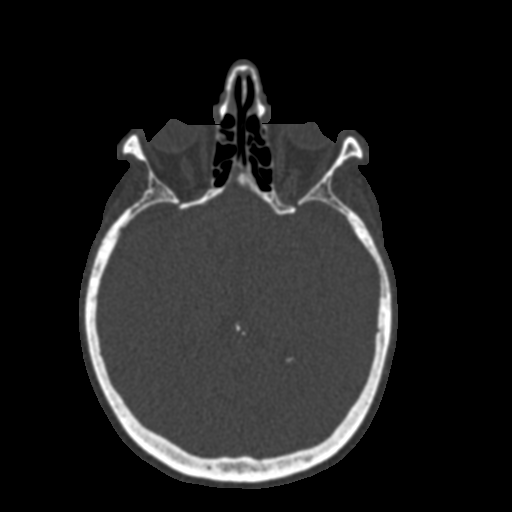
[im 146/265  brain]
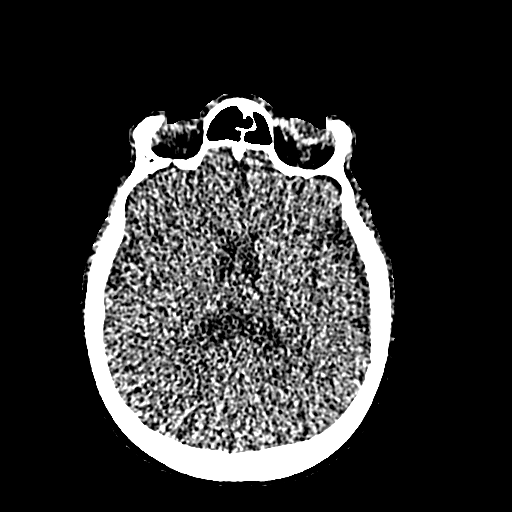
[im 146/265  bone]
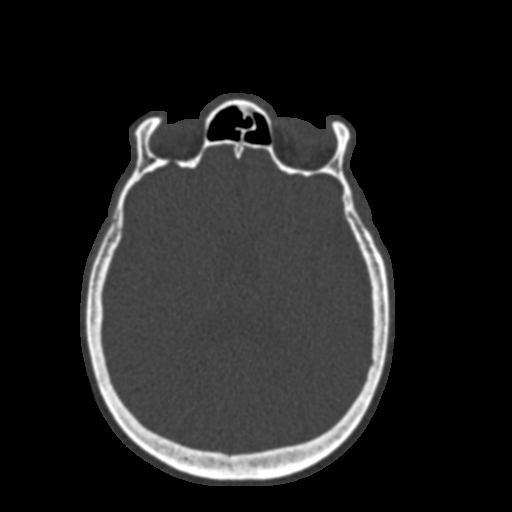
[im 183/265  bone]
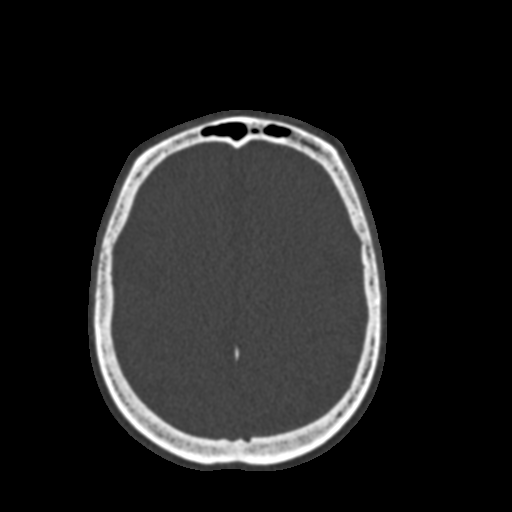
[im 210/265  bone]
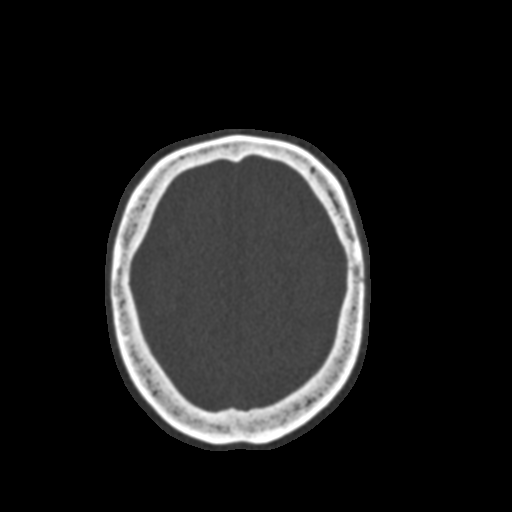
[im 246/265  bone]
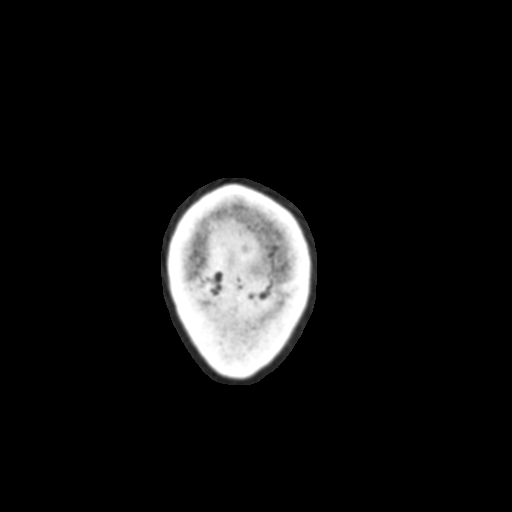

[Series 4: cor · coronal · 0.32mm/px · 3 of 194 slices shown]
[im 65/194  bone]
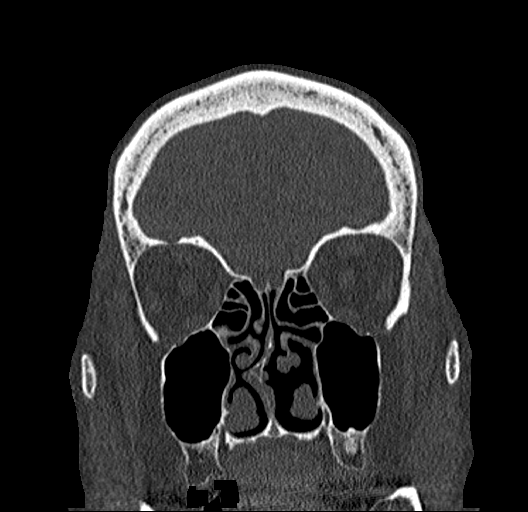
[im 86/194  bone]
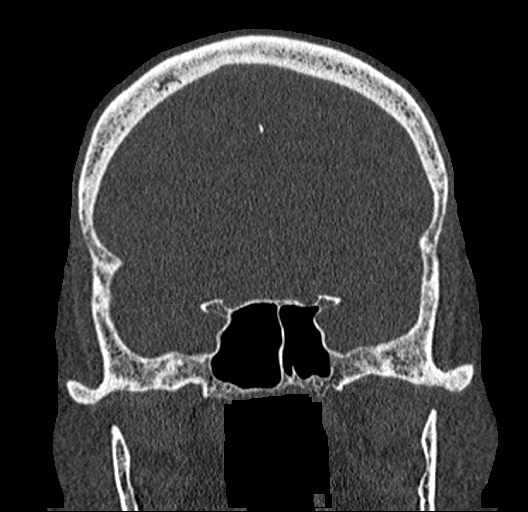
[im 108/194  bone]
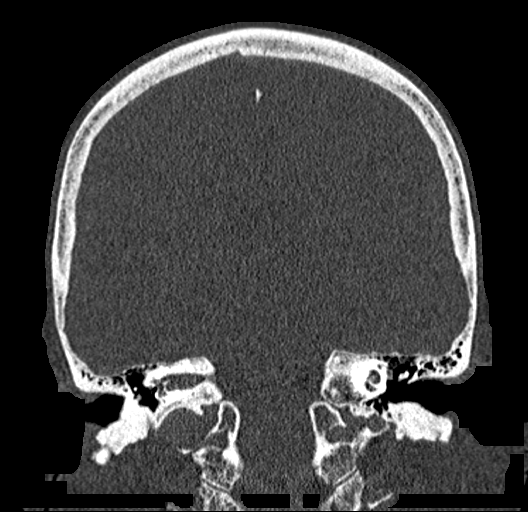

[Series 5: sag · sagittal · 0.34mm/px · 3 of 140 slices shown]
[im 47/140  bone]
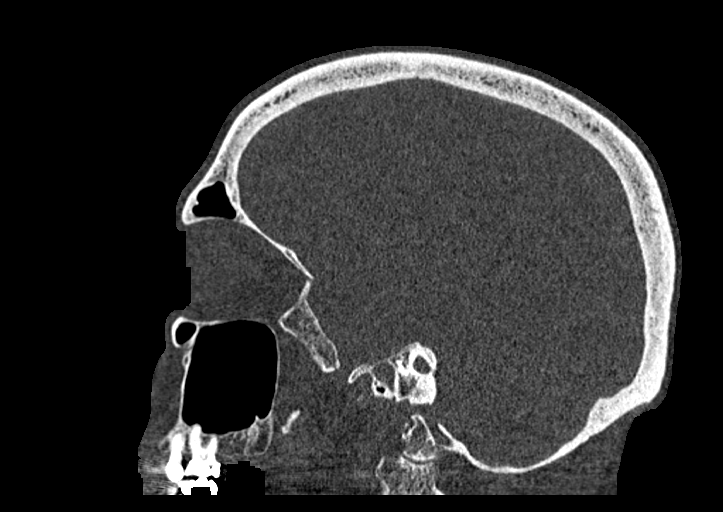
[im 70/140  bone]
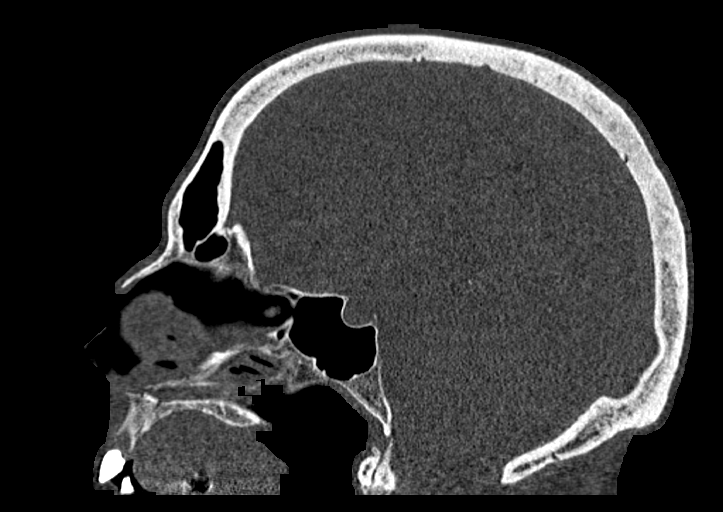
[im 93/140  bone]
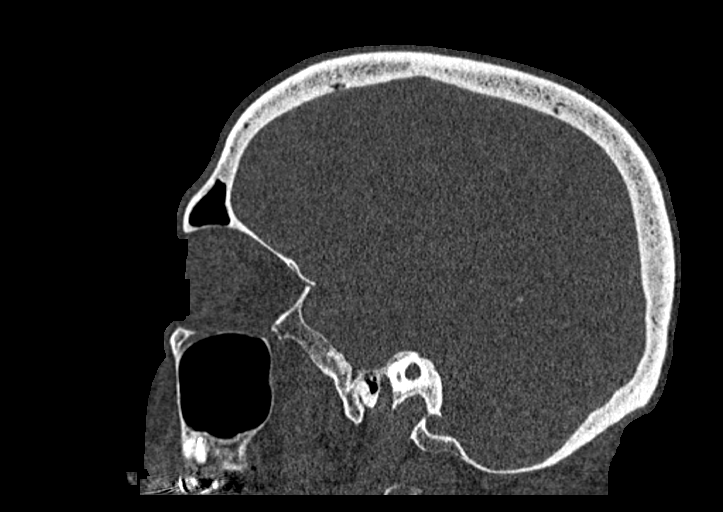

[14 of 47 positions shown; findings below may reference images not displayed]

Count of known CT and Cardiac Nuclear Medicine studies performed in the previous 
12 months = 0.
FINDINGS: -------------------------------------------------------------------------------- 
------------------------- 
SINUSES: 
FRONTAL SINUSES: Clear.     
ETHMOID AIR CELLS: Clear.     
SPHENOID SINUSES: Clear.     
MAXILLARY SINUSES: Clear. The floors of the maxillary sinuses are intact.  
PARANASAL SINUS DRAINAGE PATHWAYS: Patency of the frontoethmoidal recesses.  
Patency of sphenoethmoidal recesses.  Patency of the ostiomeatal units. 
-------------------------------------------------------------------------------- 
----------------------- 
OTHER: 

NASAL CAVITY / SKULL BASE: Nasal septum is deviated towards the right side with 
right-sided nasal septal spur. Bilateral concha bullosa.  Skull base is intact.  
Mastoid air cells and middle ear cavities are clear. 
NON-SINUS STRUCTURES: No abnormality of the visualized orbits or intracranial 
compartments. 

-------------------------------------------------------------------------------- 
---------------------
IMPRESSION: Clear paranasal sinuses. 
RADIATION DOSE REDUCTION: All CT scans are performed using radiation dose 
reduction techniques, when applicable.  Technical factors are evaluated and 
adjusted to ensure appropriate moderation of exposure.  Automated dose 
management technology is applied to adjust the radiation doses to minimize 
exposure while achieving diagnostic quality images.

## 2023-11-07 IMAGING — DX ANKLE 3 VIEWS RIGHT
3 series · 3 of 3 positions shown · non-contrast
Comparison: none

________________________________________________________________________________________________ 
ANKLE 3 VIEWS RIGHT, ANKLE 3 VIEWS LEFT, 11/07/2023 [DATE]: 
CLINICAL INDICATION: Ankle pain 
COMPARISON EXAMINATIONS: None

[AP]
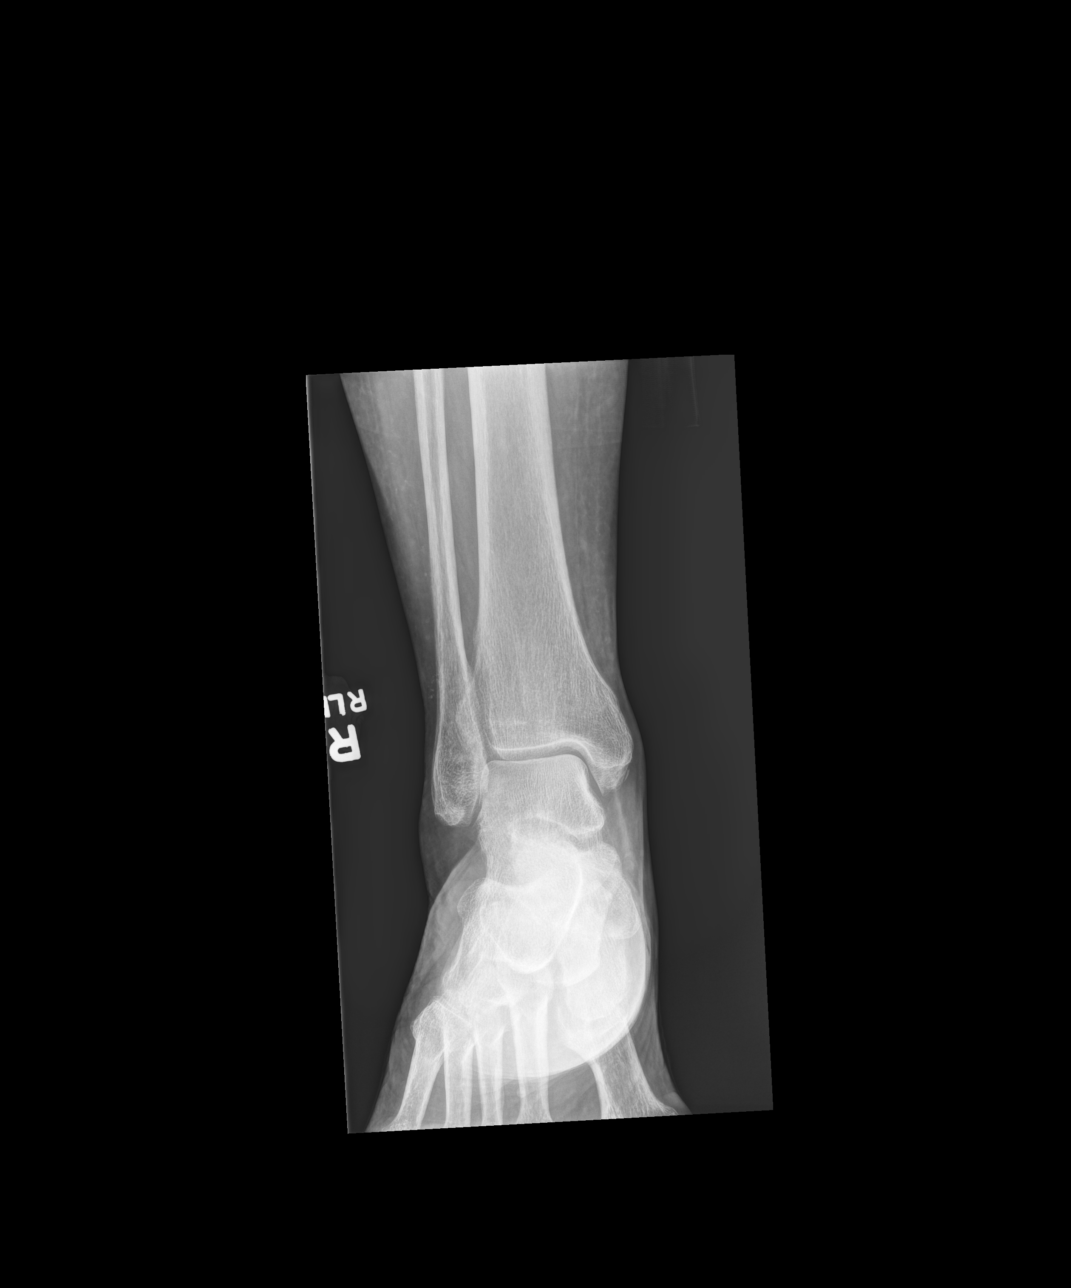

[oblique]
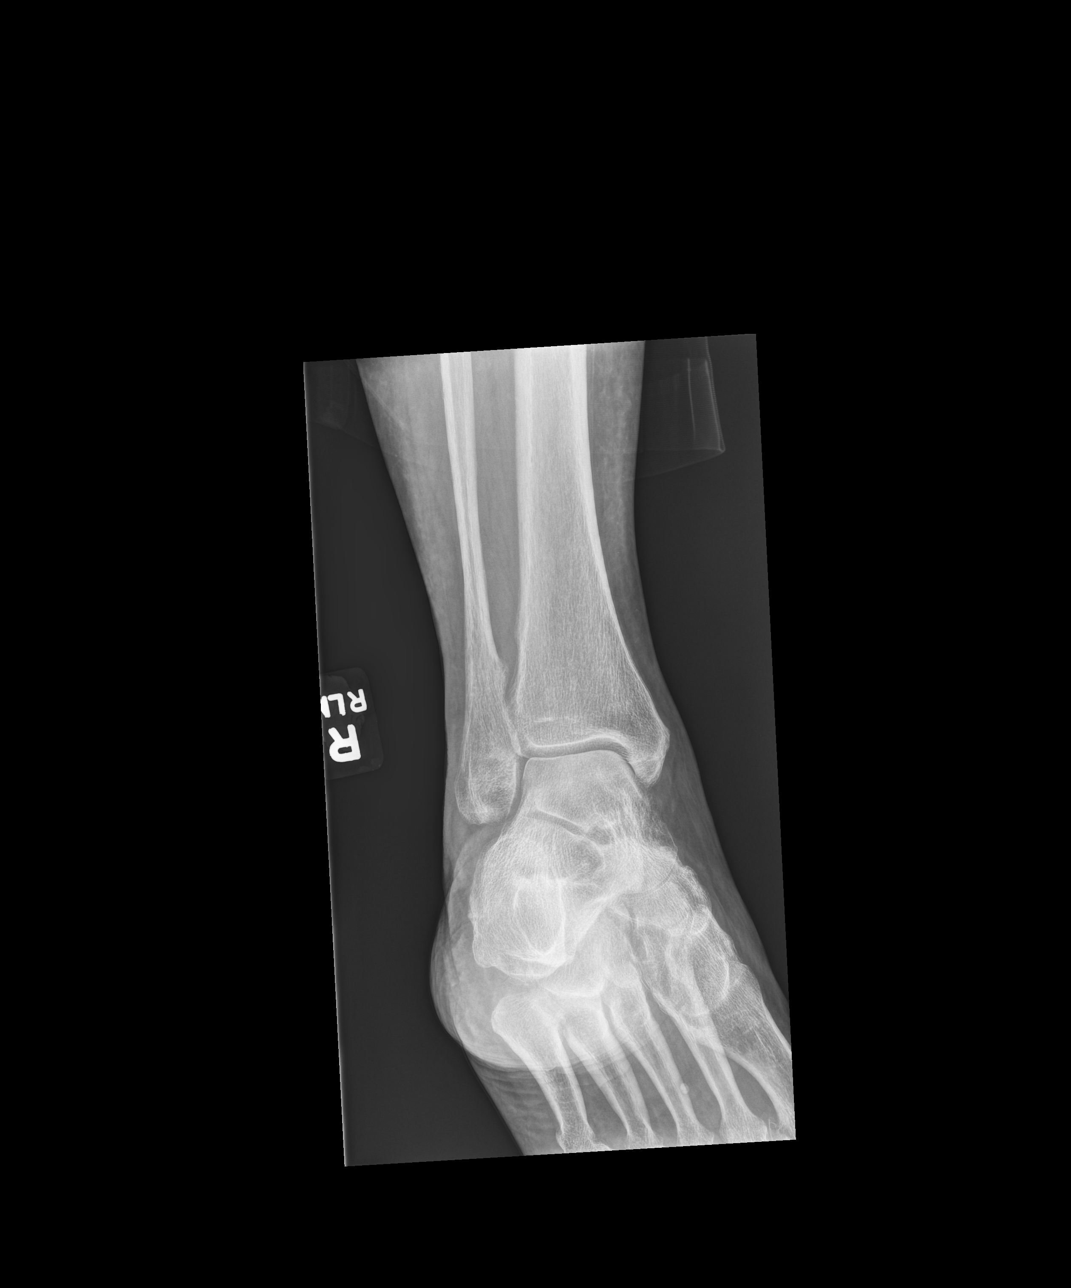

[lateral]
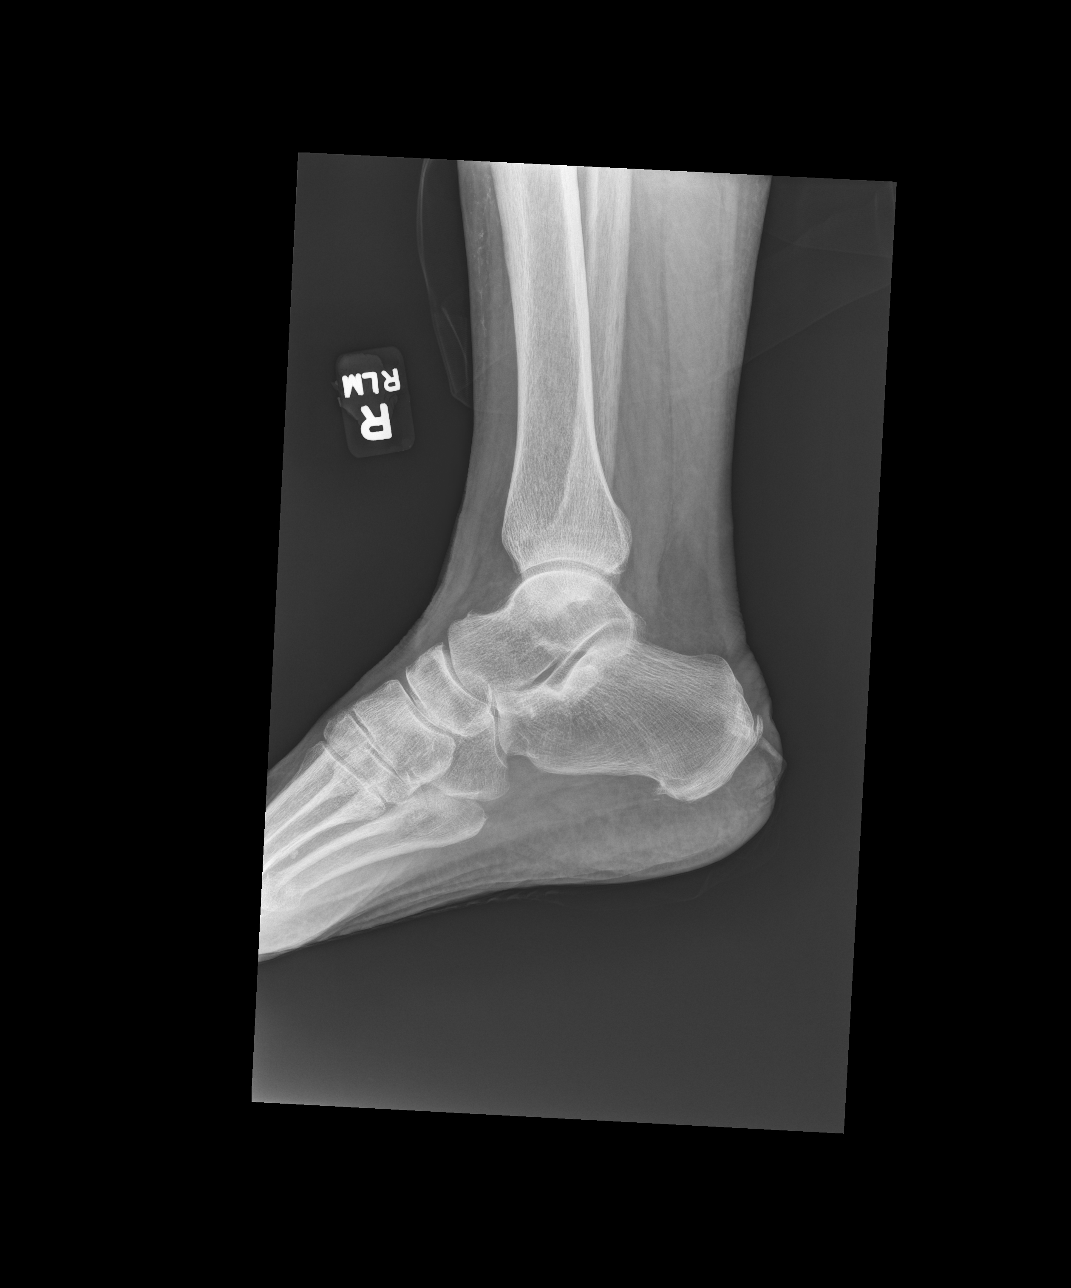

[3 of 3 positions shown; findings below may reference images not displayed]

FINDINGS: No fracture. Normal alignment. Talar dome is preserved. Bilateral 
calcaneal enthesophytes. Minimal spurring of the dorsal talar necks. Osteopenia. 
No focal soft tissue swelling.
IMPRESSION: 1.  Joint spaces are preserved.  
2.  Mild enthesopathy and osteopenia.

## 2023-11-07 IMAGING — DX ANKLE 3 VIEWS LEFT
3 series · 3 of 3 positions shown · non-contrast
Comparison: none

________________________________________________________________________________________________ 
ANKLE 3 VIEWS RIGHT, ANKLE 3 VIEWS LEFT, 11/07/2023 [DATE]: 
CLINICAL INDICATION: Ankle pain 
COMPARISON EXAMINATIONS: None

[AP]
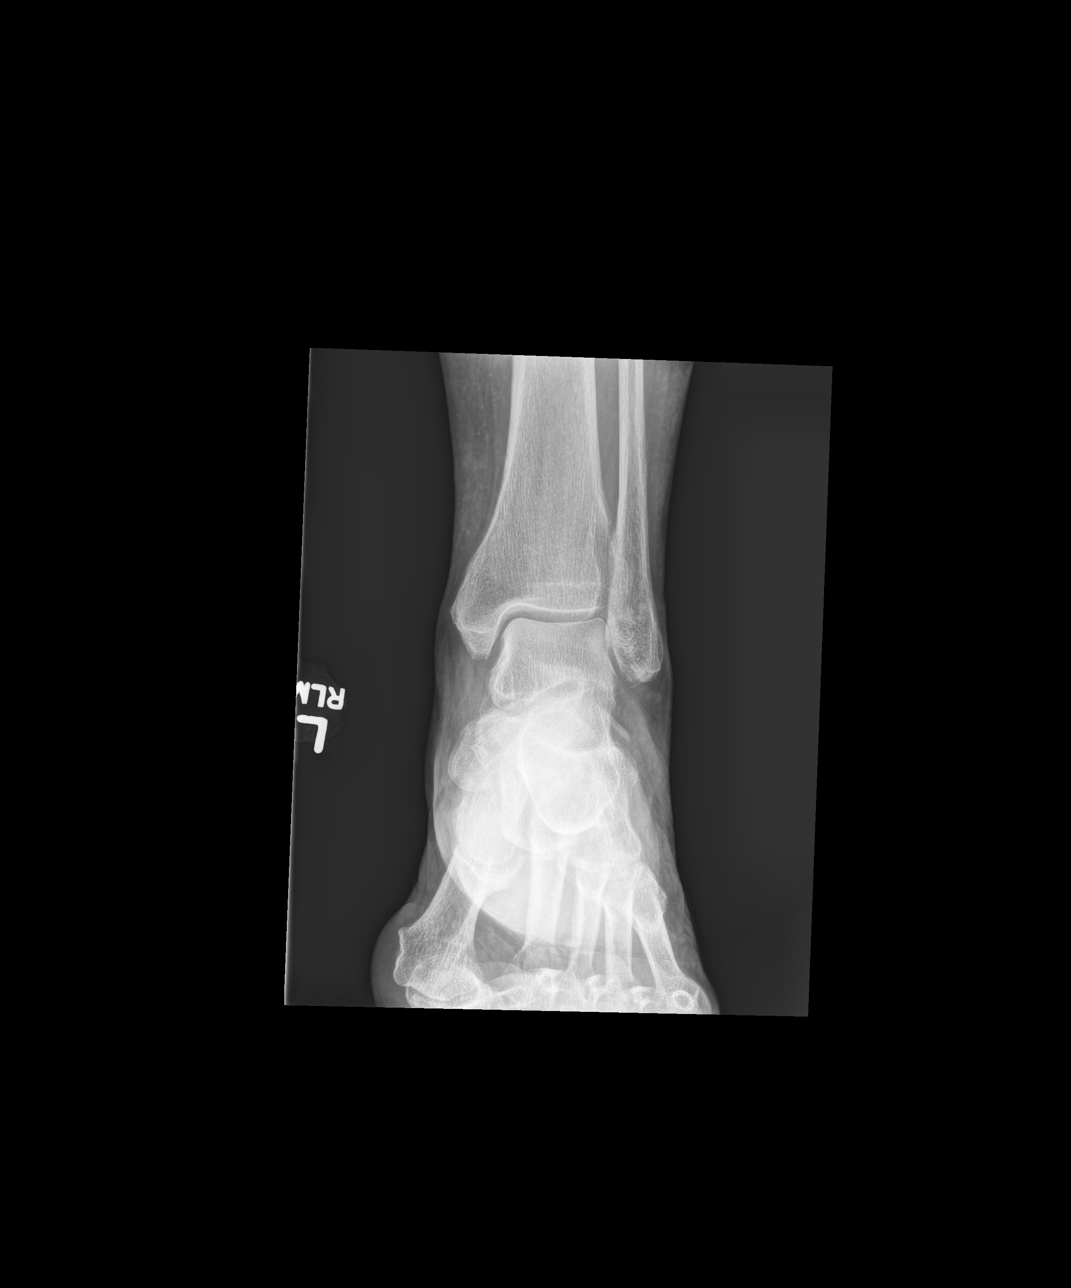

[oblique]
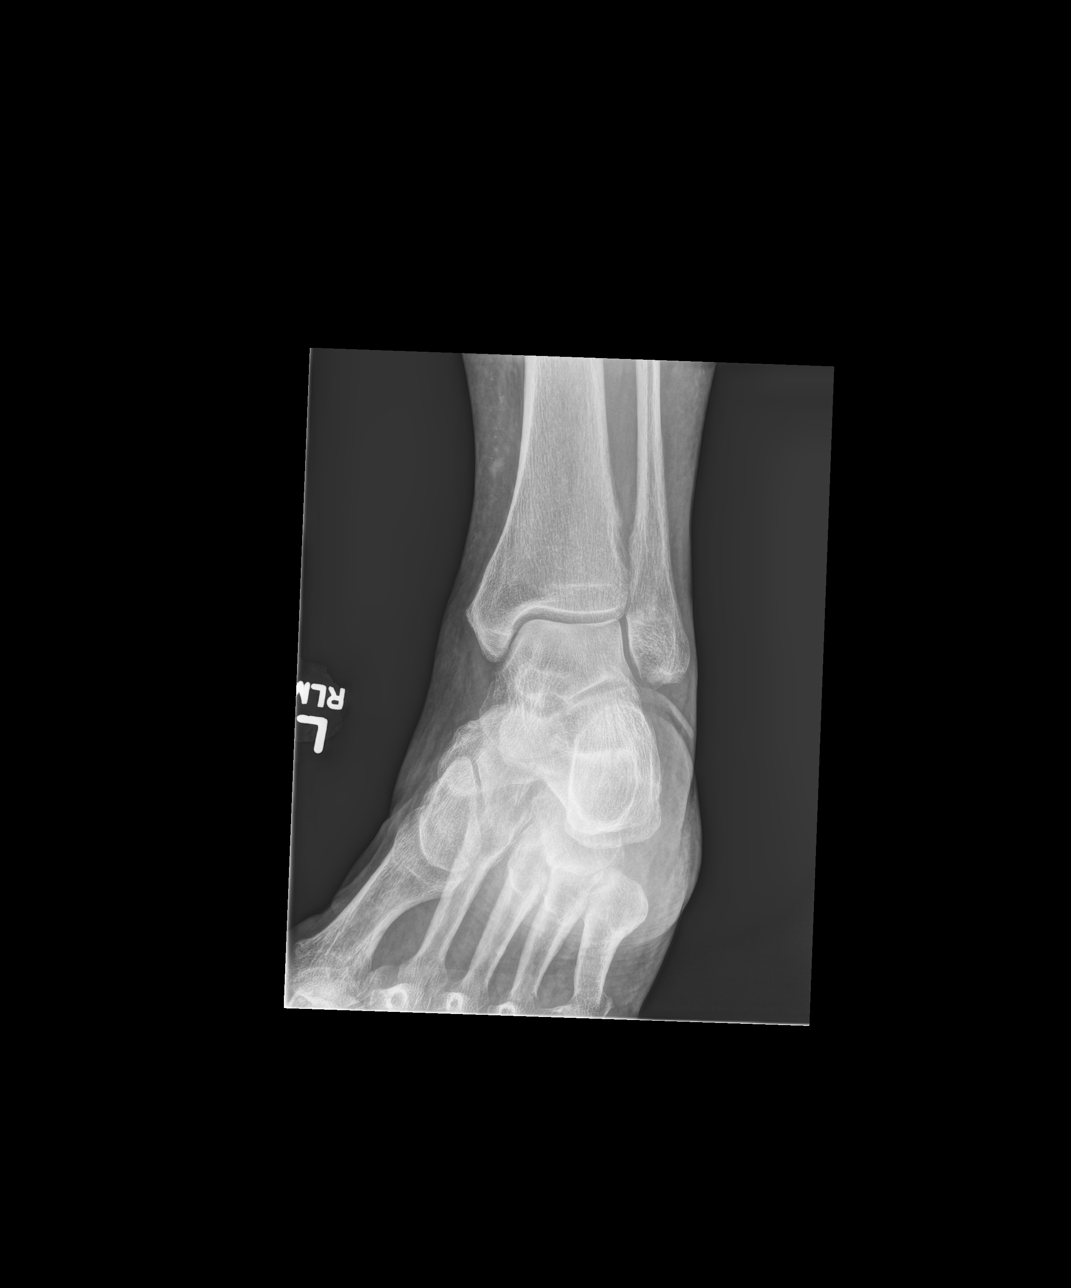

[lateral]
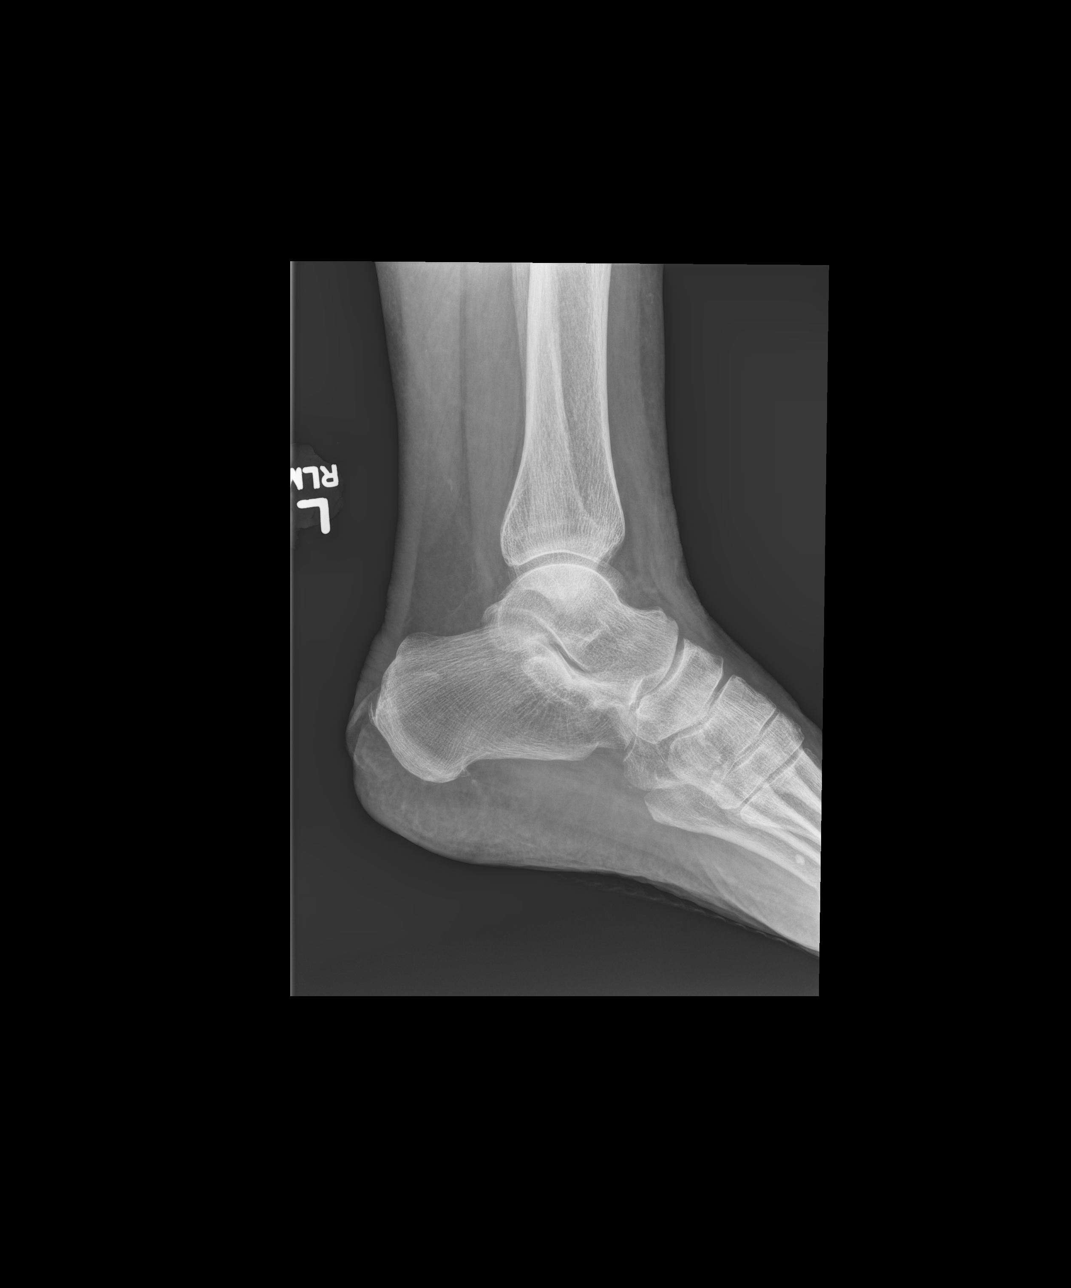

[3 of 3 positions shown; findings below may reference images not displayed]

FINDINGS: No fracture. Normal alignment. Talar dome is preserved. Bilateral 
calcaneal enthesophytes. Minimal spurring of the dorsal talar necks. Osteopenia. 
No focal soft tissue swelling.
IMPRESSION: 1.  Joint spaces are preserved.  
2.  Mild enthesopathy and osteopenia.
